# Patient Record
Sex: Female | Born: 1990 | Race: White | Hispanic: No | Marital: Single | Smoking: Never smoker
Health system: Southern US, Community
[De-identification: ages and names within clinical notes are randomized; demographics above are authoritative.]

## PROBLEM LIST (undated history)

## (undated) DIAGNOSIS — J3501 Chronic tonsillitis: Secondary | ICD-10-CM

## (undated) DIAGNOSIS — F32A Depression, unspecified: Secondary | ICD-10-CM

## (undated) DIAGNOSIS — F419 Anxiety disorder, unspecified: Secondary | ICD-10-CM

## (undated) DIAGNOSIS — F329 Major depressive disorder, single episode, unspecified: Secondary | ICD-10-CM

## (undated) DIAGNOSIS — R110 Nausea: Secondary | ICD-10-CM

## (undated) DIAGNOSIS — G43019 Migraine without aura, intractable, without status migrainosus: Principal | ICD-10-CM

## (undated) DIAGNOSIS — F40231 Fear of injections and transfusions: Secondary | ICD-10-CM

## (undated) HISTORY — DX: Nausea: R11.0

## (undated) HISTORY — DX: Migraine without aura, intractable, without status migrainosus: G43.019

## (undated) HISTORY — PX: WISDOM TOOTH EXTRACTION: SHX21

---

## 2012-01-01 ENCOUNTER — Encounter: Payer: Self-pay | Admitting: *Deleted

## 2012-01-01 ENCOUNTER — Emergency Department (HOSPITAL_COMMUNITY)
Admission: EM | Admit: 2012-01-01 | Discharge: 2012-01-01 | Disposition: A | Discharge status: Home / Self Care | Payer: 59 | Source: Home / Self Care | Attending: Emergency Medicine | Admitting: Emergency Medicine

## 2012-01-01 DIAGNOSIS — L920 Granuloma annulare: Secondary | ICD-10-CM

## 2012-01-01 DIAGNOSIS — L538 Other specified erythematous conditions: Secondary | ICD-10-CM

## 2012-01-01 MED ORDER — TRIAMCINOLONE ACETONIDE 0.1 % EX CREA: TOPICAL_CREAM | Freq: Three times a day (TID) | CUTANEOUS | Status: AC

## 2012-01-01 NOTE — ED Notes (Signed)
pT HAS  A  SMALL  RED  LESION  TO  HER   L  UPPER  ARM  WHICH  SHE  SAYS    HAS  BEEN THERE  FOR  ABOUT  1  WEEK       SHE  IS  BEING TX  FOR A  TOENAIL  FUNGUS  AND  IS TAKING  LAMISIL  FOR THE  SYMPTOMS

## 2012-01-01 NOTE — ED Provider Notes (Signed)
History     CSN: [id number]  Arrival date & time 01/01/12  1310   First MD Initiated Contact with Patient 01/01/12 1520      Chief Complaint  Patient presents with  . Rash    (Consider location/radiation/quality/duration/timing/severity/associated sxs/prior treatment) HPI Comments: Ms. Morello has had a one-week history of a mildly pruritic rash on the left upper arm. This has been somewhat scaly and seems to be enlarging in size. Right now she is taking Lamisil for a fungal toenail infection. She denies coming in contact with anyone with a similar rash and denies any other skin lesions.  Patient is a 21 y.o. female presenting with rash.  Rash     History reviewed. No pertinent past medical history.  History reviewed. No pertinent past surgical history.  History reviewed. No pertinent family history.  History  Substance Use Topics  . Smoking status: Not on file  . Smokeless tobacco: Not on file  . Alcohol Use:     OB History    Grav Para Term Preterm Abortions TAB SAB Ect Mult Living                  Review of Systems  Constitutional: Negative for fever and chills.  Skin: Positive for rash. Negative for color change, pallor and wound.    Allergies  Review of patient's allergies indicates no known allergies.  Home Medications   Current Outpatient Rx  Name Route Sig Dispense Refill  . TERBINAFINE HCL 250 MG PO TABS Oral Take 250 mg by mouth daily.      . TRIAMCINOLONE ACETONIDE 0.1 % EX CREA Topical Apply topically 3 (three) times daily. 30 g 0    BP 134/70  Pulse 80  Temp(Src) 99 F (37.2 C) (Oral)  Resp 20  SpO2 98%  LMP 12/24/2011  Physical Exam  Nursing note and vitals reviewed. Constitutional: She appears well-developed and well-nourished. No distress.  Skin: Skin is warm and dry. Rash noted. No abrasion, no bruising, no ecchymosis and no lesion noted. She is not diaphoretic. No erythema. No pallor.       There is a 5 mm, round, scaly patch on her  left upper arm with raised, well-demarcated borders.    ED Course  Procedures (including critical care time)  Labs Reviewed - No data to display No results found.   1. Granuloma annulare       MDM  My first thought upon seeing this was tenia corporis, however she is on Lamisil for toenail fungus so this makes it less likely. The other diagnostic consideration would be granuloma annulare, so we'll go ahead and treat with steroid creams.        Roque Lias, MD 01/01/12 [phone]

## 2015-07-31 DIAGNOSIS — J3501 Chronic tonsillitis: Secondary | ICD-10-CM

## 2015-07-31 HISTORY — DX: Chronic tonsillitis: J35.01

## 2015-08-01 ENCOUNTER — Encounter (HOSPITAL_BASED_OUTPATIENT_CLINIC_OR_DEPARTMENT_OTHER): Payer: Self-pay | Admitting: *Deleted

## 2015-08-04 ENCOUNTER — Ambulatory Visit (HOSPITAL_BASED_OUTPATIENT_CLINIC_OR_DEPARTMENT_OTHER): Payer: 59 | Admitting: Anesthesiology

## 2015-08-04 ENCOUNTER — Ambulatory Visit (HOSPITAL_BASED_OUTPATIENT_CLINIC_OR_DEPARTMENT_OTHER)
Admission: RE | Admit: 2015-08-04 | Discharge: 2015-08-05 | Disposition: A | Discharge status: Home / Self Care | Payer: 59 | Source: Ambulatory Visit | Attending: Otolaryngology | Admitting: Otolaryngology

## 2015-08-04 ENCOUNTER — Encounter (HOSPITAL_BASED_OUTPATIENT_CLINIC_OR_DEPARTMENT_OTHER): Payer: Self-pay | Admitting: *Deleted

## 2015-08-04 ENCOUNTER — Encounter (HOSPITAL_BASED_OUTPATIENT_CLINIC_OR_DEPARTMENT_OTHER)
Admission: RE | Disposition: A | Discharge status: Home / Self Care | Payer: Self-pay | Source: Ambulatory Visit | Attending: Otolaryngology

## 2015-08-04 DIAGNOSIS — J3501 Chronic tonsillitis: Secondary | ICD-10-CM | POA: Diagnosis present

## 2015-08-04 HISTORY — DX: Fear of injections and transfusions: F40.231

## 2015-08-04 HISTORY — PX: TONSILLECTOMY: SHX5217

## 2015-08-04 HISTORY — DX: Anxiety disorder, unspecified: F41.9

## 2015-08-04 HISTORY — DX: Major depressive disorder, single episode, unspecified: F32.9

## 2015-08-04 HISTORY — DX: Chronic tonsillitis: J35.01

## 2015-08-04 HISTORY — DX: Depression, unspecified: F32.A

## 2015-08-04 SURGERY — TONSILLECTOMY
Anesthesia: General | Site: Mouth

## 2015-08-04 MED ORDER — BACITRACIN ZINC 500 UNIT/GM EX OINT: 1.0000 "application " | TOPICAL_OINTMENT | Freq: Three times a day (TID) | CUTANEOUS | Status: DC

## 2015-08-04 MED ORDER — OXYCODONE HCL 5 MG/5ML PO SOLN: 5.0000 mg | ORAL | Status: DC | PRN

## 2015-08-04 MED ORDER — PROMETHAZINE HCL 25 MG/ML IJ SOLN: 6.2500 mg | INTRAMUSCULAR | Status: DC | PRN

## 2015-08-04 MED ORDER — SUCCINYLCHOLINE CHLORIDE 20 MG/ML IJ SOLN
INTRAMUSCULAR | Status: DC | PRN
  Administered 2015-08-04: 100 mg via INTRAVENOUS

## 2015-08-04 MED ORDER — LIDOCAINE 4 % EX CREA
TOPICAL_CREAM | CUTANEOUS | Status: AC
  Filled 2015-08-04: qty 5

## 2015-08-04 MED ORDER — FENTANYL CITRATE (PF) 100 MCG/2ML IJ SOLN
50.0000 ug | INTRAMUSCULAR | Status: DC | PRN
  Administered 2015-08-04 (×2): 50 ug via INTRAVENOUS

## 2015-08-04 MED ORDER — MEPERIDINE HCL 25 MG/ML IJ SOLN: 6.2500 mg | INTRAMUSCULAR | Status: DC | PRN

## 2015-08-04 MED ORDER — OXYCODONE HCL 5 MG PO TABS: 5.0000 mg | ORAL_TABLET | Freq: Once | ORAL | Status: AC | PRN

## 2015-08-04 MED ORDER — MIDAZOLAM HCL 2 MG/2ML IJ SOLN
1.0000 mg | INTRAMUSCULAR | Status: DC | PRN
  Administered 2015-08-04: 1 mg via INTRAVENOUS

## 2015-08-04 MED ORDER — HYDROMORPHONE HCL 1 MG/ML IJ SOLN
INTRAMUSCULAR | Status: AC
  Filled 2015-08-04: qty 1

## 2015-08-04 MED ORDER — MIDAZOLAM HCL 2 MG/2ML IJ SOLN
INTRAMUSCULAR | Status: AC
  Filled 2015-08-04: qty 2

## 2015-08-04 MED ORDER — FENTANYL CITRATE (PF) 100 MCG/2ML IJ SOLN
INTRAMUSCULAR | Status: AC
  Filled 2015-08-04: qty 4

## 2015-08-04 MED ORDER — PROPOFOL 10 MG/ML IV BOLUS
INTRAVENOUS | Status: DC | PRN
  Administered 2015-08-04: 100 mg via INTRAVENOUS

## 2015-08-04 MED ORDER — PROPOFOL 10 MG/ML IV BOLUS
INTRAVENOUS | Status: AC
  Filled 2015-08-04: qty 20

## 2015-08-04 MED ORDER — ONDANSETRON HCL 4 MG/2ML IJ SOLN
4.0000 mg | INTRAMUSCULAR | Status: DC | PRN
Start: 2015-08-04 — End: 2015-08-05

## 2015-08-04 MED ORDER — DEXAMETHASONE SODIUM PHOSPHATE 4 MG/ML IJ SOLN
INTRAMUSCULAR | Status: DC | PRN
  Administered 2015-08-04: 10 mg via INTRAVENOUS

## 2015-08-04 MED ORDER — OXYCODONE HCL 5 MG/5ML PO SOLN
5.0000 mg | ORAL | Status: DC | PRN
  Administered 2015-08-04 – 2015-08-05 (×3): 5 mg via ORAL
  Filled 2015-08-04 (×2): qty 5

## 2015-08-04 MED ORDER — ONDANSETRON HCL 4 MG PO TABS: 4.0000 mg | ORAL_TABLET | ORAL | Status: DC | PRN

## 2015-08-04 MED ORDER — HYDROMORPHONE HCL 1 MG/ML IJ SOLN
0.2500 mg | INTRAMUSCULAR | Status: DC | PRN
  Administered 2015-08-04: 0.5 mg via INTRAVENOUS
  Administered 2015-08-04: 0.25 mg via INTRAVENOUS

## 2015-08-04 MED ORDER — GLYCOPYRROLATE 0.2 MG/ML IJ SOLN: 0.2000 mg | Freq: Once | INTRAMUSCULAR | Status: DC | PRN

## 2015-08-04 MED ORDER — OXYCODONE HCL 5 MG/5ML PO SOLN
5.0000 mg | Freq: Once | ORAL | Status: AC | PRN
  Filled 2015-08-04: qty 5

## 2015-08-04 MED ORDER — LACTATED RINGERS IV SOLN
INTRAVENOUS | Status: DC
  Administered 2015-08-04: 11:00:00 via INTRAVENOUS

## 2015-08-04 MED ORDER — SCOPOLAMINE 1 MG/3DAYS TD PT72
1.0000 | MEDICATED_PATCH | Freq: Once | TRANSDERMAL | Status: DC | PRN
  Administered 2015-08-04: 1.5 mg via TRANSDERMAL

## 2015-08-04 MED ORDER — ONDANSETRON HCL 4 MG/2ML IJ SOLN
INTRAMUSCULAR | Status: DC | PRN
  Administered 2015-08-04: 4 mg via INTRAVENOUS

## 2015-08-04 MED ORDER — SCOPOLAMINE 1 MG/3DAYS TD PT72
MEDICATED_PATCH | TRANSDERMAL | Status: AC
Start: 2015-08-04 — End: 2015-08-04
  Filled 2015-08-04: qty 1

## 2015-08-04 MED ORDER — DEXTROSE-NACL 5-0.45 % IV SOLN
INTRAVENOUS | Status: DC
  Administered 2015-08-04: 12:00:00 via INTRAVENOUS

## 2015-08-04 SURGICAL SUPPLY — 32 items
CANISTER SUCT 1200ML W/VALVE (MISCELLANEOUS) ×3 IMPLANT
CATH ROBINSON RED A/P 12FR (CATHETERS) IMPLANT
CLEANER CAUTERY TIP 5X5 PAD (MISCELLANEOUS) ×1 IMPLANT
COAGULATOR SUCT 6 FR SWTCH (ELECTROSURGICAL) ×1
COAGULATOR SUCT SWTCH 10FR 6 (ELECTROSURGICAL) ×2 IMPLANT
COVER MAYO STAND STRL (DRAPES) ×3 IMPLANT
ELECT COATED BLADE 2.86 ST (ELECTRODE) ×3 IMPLANT
ELECT REM PT RETURN 9FT ADLT (ELECTROSURGICAL)
ELECT REM PT RETURN 9FT PED (ELECTROSURGICAL)
ELECTRODE REM PT RETRN 9FT PED (ELECTROSURGICAL) IMPLANT
ELECTRODE REM PT RTRN 9FT ADLT (ELECTROSURGICAL) IMPLANT
GLOVE BIOGEL PI IND STRL 7.0 (GLOVE) ×2 IMPLANT
GLOVE BIOGEL PI INDICATOR 7.0 (GLOVE) ×4
GLOVE ECLIPSE 6.5 STRL STRAW (GLOVE) ×3 IMPLANT
GLOVE SS BIOGEL STRL SZ 7.5 (GLOVE) ×1 IMPLANT
GLOVE SUPERSENSE BIOGEL SZ 7.5 (GLOVE) ×2
GOWN STRL REUS W/ TWL LRG LVL3 (GOWN DISPOSABLE) ×2 IMPLANT
GOWN STRL REUS W/TWL LRG LVL3 (GOWN DISPOSABLE) ×4
MARKER SKIN DUAL TIP RULER LAB (MISCELLANEOUS) IMPLANT
NS IRRIG 1000ML POUR BTL (IV SOLUTION) ×3 IMPLANT
PAD CLEANER CAUTERY TIP 5X5 (MISCELLANEOUS) ×2
PENCIL FOOT CONTROL (ELECTRODE) ×3 IMPLANT
SHEET MEDIUM DRAPE 40X70 STRL (DRAPES) ×3 IMPLANT
SPONGE GAUZE 4X4 12PLY STER LF (GAUZE/BANDAGES/DRESSINGS) ×3 IMPLANT
SPONGE TONSIL 1 RF SGL (DISPOSABLE) IMPLANT
SPONGE TONSIL 1.25 RF SGL STRG (GAUZE/BANDAGES/DRESSINGS) IMPLANT
SYR BULB 3OZ (MISCELLANEOUS) ×3 IMPLANT
TOWEL OR 17X24 6PK STRL BLUE (TOWEL DISPOSABLE) ×3 IMPLANT
TUBE CONNECTING 20'X1/4 (TUBING) ×1
TUBE CONNECTING 20X1/4 (TUBING) ×2 IMPLANT
TUBE SALEM SUMP 12R W/ARV (TUBING) IMPLANT
TUBE SALEM SUMP 16 FR W/ARV (TUBING) IMPLANT

## 2015-08-04 NOTE — Op Note (Signed)
Preop/postop diagnosis: Chronic tonsillitis Procedure: Tonsillectomy Anesthesia: Gen. Estimated blood loss: Less than 5 mL Indications: 24 year old who's had chronic reproductive tonsil issues with cryptic debris. She was informed risks and benefits of the procedure and options were discussed all questions are answered and consent was obtained. Procedure: Patient was taken to the operating room placed in the supine position after general endotracheal tube anesthesia was placed in the rose position. She was draped in the usual sterile manner. The Crowe Davis mouth gag was inserted retracted and suspended from the Mayo stand. The left tonsil begun making a left answer tonsillar pillar incision identifying the capsule tonsil and removing it with electrocautery dissection right tonsil removed in the same fashion. Both tonsils had cryptic debris throughout. Hemostasis achieved with suction cautery. The hypopharynx esophagus stomach were suctioned with the NG tube. The Crowe-Davis was released and resuspended and is hemostasis present in all locations. The patient was then awakened brought to recovery room in stable condition counts correct

## 2015-08-04 NOTE — Anesthesia Procedure Notes (Signed)
Procedure Name: Intubation Date/Time: 08/04/2015 10:36 AM Performed by: Gar Gibbon Pre-anesthesia Checklist: Patient identified, Emergency Drugs available, Suction available and Patient being monitored Patient Re-evaluated:Patient Re-evaluated prior to inductionOxygen Delivery Method: Circle System Utilized Intubation Type: Inhalational induction Ventilation: Mask ventilation without difficulty and Oral airway inserted - appropriate to patient size Laryngoscope Size: Miller and 2 Tube type: Oral Tube size: 7.0 mm Number of attempts: 1 Airway Equipment and Method: Stylet Placement Confirmation: ETT inserted through vocal cords under direct vision,  positive ETCO2 and breath sounds checked- equal and bilateral Secured at: 22 cm Tube secured with: Tape Dental Injury: Teeth and Oropharynx as per pre-operative assessment

## 2015-08-04 NOTE — Transfer of Care (Signed)
Immediate Anesthesia Transfer of Care Note  Patient: Valerie Sherman  Procedure(s) Performed: Procedure(s): TONSILLECTOMY (N/A)  Patient Location: PACU  Anesthesia Type:General  Level of Consciousness: alert , sedated and patient cooperative  Airway & Oxygen Therapy: Patient Spontanous Breathing and aerosol face mask  Post-op Assessment: Report given to RN and Post -op Vital signs reviewed and stable  Post vital signs: Reviewed and stable  Last Vitals:  Filed Vitals:   08/04/15 0919  BP: 128/71  Pulse: 90  Temp: 36.9 C  Resp: 18    Complications: No apparent anesthesia complications

## 2015-08-04 NOTE — H&P (Signed)
Valerie Sherman is an 24 y.o. female.   Chief Complaint: tonsillits HPI: hx of tonsillitis and now ready to have removed  Past Medical History  Diagnosis Date  . Chronic tonsillitis 07/2015  . Anxiety   . Depression   . Severe needle phobia     Past Surgical History  Procedure Laterality Date  . Wisdom tooth extraction      History reviewed. No pertinent family history. Social History:  reports that she has never smoked. She has never used smokeless tobacco. She reports that she drinks alcohol. She reports that she does not use illicit drugs.  Allergies:  Allergies  Allergen Reactions  . Augmentin [Amoxicillin-Pot Clavulanate] Nausea And Vomiting    Medications Prior to Admission  Medication Sig Dispense Refill  . cholecalciferol (VITAMIN D) 1000 UNITS tablet Take 4,000 Units by mouth daily.    Marland Kitchen escitalopram (LEXAPRO) 20 MG tablet Take 20 mg by mouth daily.    Marland Kitchen etonogestrel-ethinyl estradiol (NUVARING) 0.12-0.015 MG/24HR vaginal ring Place 1 each vaginally every 28 (twenty-eight) days. Insert vaginally and leave in place for 3 consecutive weeks, then remove for 1 week.    . hydrOXYzine (ATARAX/VISTARIL) 25 MG tablet Take 25 mg by mouth 3 (three) times daily as needed.    Marland Kitchen LYSINE PO Take 1 g by mouth daily.    . Multiple Vitamin (MULTIVITAMIN) tablet Take 1 tablet by mouth daily.      No results found for this or any previous visit (from the past 48 hour(s)). No results found.  Review of Systems  Constitutional: Negative.   HENT: Negative.   Eyes: Negative.   Respiratory: Negative.   Cardiovascular: Negative.   Gastrointestinal: Negative.   Skin: Negative.     Blood pressure 128/71, pulse 90, temperature 98.4 F (36.9 C), temperature source Oral, resp. rate 18, height  (1.702 m), weight 65.318 kg (144 lb), last menstrual period 06/21/2015, SpO2 100 %. Physical Exam  Constitutional: She appears well-developed and well-nourished.  HENT:  Head: Normocephalic  and atraumatic.  Nose: Nose normal.  Eyes: Conjunctivae are normal. Pupils are equal, round, and reactive to light.  Neck: Normal range of motion. Neck supple.  Cardiovascular: Normal rate.   Respiratory: Effort normal.  GI: Soft.  Musculoskeletal: Normal range of motion.     Assessment/Plan Chronic tonsillitis- ready to proceed with surgery and procedure discussed  Suzanna Obey 08/04/2015, 10:15 AM

## 2015-08-04 NOTE — Anesthesia Preprocedure Evaluation (Signed)
Anesthesia Evaluation  Patient identified by MRN, date of birth, ID band Patient awake    Reviewed: Allergy & Precautions, NPO status , Patient's Chart, lab work & pertinent test results  Airway Mallampati: II  TM Distance: >3 FB Neck ROM: Full    Dental no notable dental hx.    Pulmonary neg pulmonary ROS,  breath sounds clear to auscultation  Pulmonary exam normal       Cardiovascular negative cardio ROS Normal cardiovascular examRhythm:Regular Rate:Normal     Neuro/Psych PSYCHIATRIC DISORDERS negative neurological ROS     GI/Hepatic negative GI ROS, Neg liver ROS,   Endo/Other  negative endocrine ROS  Renal/GU negative Renal ROS     Musculoskeletal negative musculoskeletal ROS (+)   Abdominal   Peds  Hematology negative hematology ROS (+)   Anesthesia Other Findings   Reproductive/Obstetrics negative OB ROS                             Anesthesia Physical Anesthesia Plan  ASA: I  Anesthesia Plan: General   Post-op Pain Management:    Induction: Intravenous  Airway Management Planned: Oral ETT  Additional Equipment:   Intra-op Plan:   Post-operative Plan: Extubation in OR  Informed Consent: I have reviewed the patients History and Physical, chart, labs and discussed the procedure including the risks, benefits and alternatives for the proposed anesthesia with the patient or authorized representative who has indicated his/her understanding and acceptance.   Dental advisory given  Plan Discussed with: CRNA  Anesthesia Plan Comments:         Anesthesia Quick Evaluation

## 2015-08-04 NOTE — Anesthesia Postprocedure Evaluation (Signed)
  Anesthesia Post-op Note  Patient: Valerie Sherman  Procedure(s) Performed: Procedure(s): TONSILLECTOMY (N/A)  Patient Location: PACU  Anesthesia Type: General   Level of Consciousness: awake, alert  and oriented  Airway and Oxygen Therapy: Patient Spontanous Breathing  Post-op Pain: mild  Post-op Assessment: Post-op Vital signs reviewed  Post-op Vital Signs: Reviewed  Last Vitals:  Filed Vitals:   08/04/15 1142  BP: 138/80  Pulse: 85  Temp: 36.1 C  Resp: 18    Complications: No apparent anesthesia complications

## 2015-08-04 NOTE — Discharge Instructions (Addendum)
Standard tonsillectomy instructions given to you from [facility] outpatient should be followed. Call if there's any bleeding or any questions whatsoever.Tonsillectomy A tonsillectomy is a surgery to remove your tonsils. Tonsils are lymph tissues at the back and upper part of your throat. Because tonsils can collect debris, they can become infected. Tonsillectomy often is done when nonsurgical treatments have been unsuccessful in resolving problems with tonsils.  LET [facility] CARE PROVIDER KNOW ABOUT:  Any allergies you have.  All medicines you are taking, including vitamins, herbs, eye drops, creams, and over-the-counter medicines, especially those containing aspirin or ibuprofen.  Previous problems you or members of your family have had with the use of anesthetics.  Any blood disorders you have.  Previous surgeries you have had.  Medical conditions you have.  Recent cough or fever you have had. RISKS AND COMPLICATIONS Generally, tonsillectomy is a safe procedure. However, as with any procedure, problems can occur. Possible problems include:  Bleeding.  Infection.  Scarring.  Changes in your sense of taste.  Changes in your voice.  Changes in swallowing. PROCEDURE   You will be given a medicine that makes you go to sleep (general anesthetic).  A device will be placed inside of your mouth that presses your tongue down so that the tonsils at the back of your throat can be removed without cuts on the outside of your neck or throat.  Your tonsils will typically be removed with a device called an electrocautery, which will cut your tonsils out and then shrink the surrounding blood vessels at the same time so that you will not bleed after the procedure.  Usually, stitches will not be used to close the cut. A white scab (eschar) will form in the area where your tonsils used to be. AFTER THE PROCEDURE After surgery, you will be taken to a recovery area for close monitoring. Once  you are awake, stable, and taking fluids well, you will be allowed to go home.

## 2015-08-05 DIAGNOSIS — J3501 Chronic tonsillitis: Secondary | ICD-10-CM | POA: Diagnosis not present

## 2015-08-07 ENCOUNTER — Encounter (HOSPITAL_BASED_OUTPATIENT_CLINIC_OR_DEPARTMENT_OTHER): Payer: Self-pay | Admitting: Otolaryngology

## 2015-08-08 ENCOUNTER — Other Ambulatory Visit (HOSPITAL_COMMUNITY)
Admission: RE | Admit: 2015-08-08 | Discharge: 2015-08-08 | Disposition: A | Discharge status: Home / Self Care | Payer: 59 | Source: Ambulatory Visit | Attending: Obstetrics and Gynecology | Admitting: Obstetrics and Gynecology

## 2015-08-08 ENCOUNTER — Other Ambulatory Visit: Payer: Self-pay | Admitting: Obstetrics and Gynecology

## 2015-08-08 DIAGNOSIS — Z01419 Encounter for gynecological examination (general) (routine) without abnormal findings: Secondary | ICD-10-CM | POA: Insufficient documentation

## 2015-08-09 LAB — CYTOLOGY - PAP

## 2015-11-15 ENCOUNTER — Ambulatory Visit (INDEPENDENT_AMBULATORY_CARE_PROVIDER_SITE_OTHER): Payer: 59 | Admitting: Family Medicine

## 2015-11-15 VITALS — BP 118/70 | HR 91 | Temp 98.4°F | Resp 18 | Ht 67.0 in | Wt 138.0 lb

## 2015-11-15 DIAGNOSIS — Z23 Encounter for immunization: Secondary | ICD-10-CM

## 2015-11-15 DIAGNOSIS — G479 Sleep disorder, unspecified: Secondary | ICD-10-CM | POA: Diagnosis not present

## 2015-11-15 DIAGNOSIS — F32A Depression, unspecified: Secondary | ICD-10-CM

## 2015-11-15 DIAGNOSIS — F411 Generalized anxiety disorder: Secondary | ICD-10-CM

## 2015-11-15 DIAGNOSIS — Z0000 Encounter for general adult medical examination without abnormal findings: Secondary | ICD-10-CM

## 2015-11-15 DIAGNOSIS — F329 Major depressive disorder, single episode, unspecified: Secondary | ICD-10-CM | POA: Diagnosis not present

## 2015-11-15 MED ORDER — HYDROXYZINE HCL 25 MG PO TABS: 25.0000 mg | ORAL_TABLET | Freq: Three times a day (TID) | ORAL | Status: DC | PRN

## 2015-11-15 MED ORDER — ESCITALOPRAM OXALATE 20 MG PO TABS: 20.0000 mg | ORAL_TABLET | Freq: Every day | ORAL | Status: DC

## 2015-11-15 NOTE — Progress Notes (Signed)
Patient ID: Valerie Sherman, female    DOB: 03-10-1991  Age: 24 y.o. MRN: [id number]  Chief Complaint  Patient presents with  . Annual Exam    has form   . Medication Refill    lexapro   . Depression    see screen    Subjective:   24 year old female who is here for an annual physical examination. She needs a refill of her Lexapro also. She has been in school, finished up with a final internship, to graduate and get out in the job work soon. She has a form for the school system.  Past medical history: Medications: Lexapro, NuvaRing, hydroxyzine Allergies: Augmentin Medical history: Anxiety and depression Surgeries: Tonsillectomy  Family history: Parents are living. Father is had Hodgkin's disease and high cholesterol. Mother is healthy. Brother has been healthy also.  Social history: Does not smoke or use drugs. She does drink couple of drinks a week. She is single has had female sexual partners. She is becoming a Runner, broadcasting/film/videoteacher. She has college education from [employer]. She does get exercise.  Review of systems: Constitutional: She has had decreased exercise and appetite has not been good. She feels like sleeping all the time. She is not lost weight however. HEENT: Has had a sore throat from what she thinks is postnasal drainage. Eyes: Wears contacts Respiratory: Has some cough Cardiovascular: Unremarkable Gastrointestinal: Unremarkable Genitourinary: Unremarkable Musculoskeletal: Unremarkable Dermatologic: Unremarkable Endocrine: Allergy/immunology: Seasonal allergies Neurologic: Unremarkable Hematologic: Unremarkable Psychiatric: Has a sad mood and is nervous and anxious often. She does have some sleep disturbance for which she uses hydroxyzine. She denies suicidal ideation.  Physical exam: Healthy-appearing young lady, alert and oriented. TMs are normal. Eyes PERRLA. Throat clear except for a little erythema down the right side. Without nodes or thyromegaly.  Chest is clear to auscultation. Heart regular without murmurs gallops or arrhythmias. Abdomen is soft without masses or tenderness. Breast and pelvic exam not done. Extremities unremarkable. Skin unremarkable.  Current allergies, medications, problem list, past/family and social histories reviewed.  Objective:  BP 118/70 mmHg  Pulse 91  Temp(Src) 98.4 F (36.9 C) (Oral)  Resp 18  Ht 5\' 7"  (1.702 m)  Wt 138 lb (62.596 kg)  BMI 21.61 kg/m2  SpO2 98%  LMP 09/14/2015  See above  Assessment & Plan:   Assessment: No diagnosis found.    Plan: Complete form for school job application. She had a PPD test earlier this year and I do not believe she needs one as she has no known exposures.        There are no Patient Instructions on file for this visit.   No Follow-up on file.   Valerie Gladwin, MD 11/15/2015

## 2015-11-15 NOTE — Progress Notes (Signed)

## 2015-11-15 NOTE — Patient Instructions (Signed)
Continue current meds  Flu shot  Consider getting a hepatitis A vaccine at some point  Try to discuss with him other whether you do need any more Gardasil vaccine or not.  Return as needed  When you get settled and a job I recommend you start seeing a counselor or psychologist for your ongoing anxiety and depression needs

## 2015-11-15 NOTE — Progress Notes (Signed)
Patient ID: Domingo Mendlanah J Gravelle, female    DOB: 27-Jul-1991  Age: 24 y.o. MRN: [id number]  Anxiety and depression Subjective:   Patient needs a refill on her medications for anxiety and depression. She continues to struggle with this. She has not seen a counselor or psychologist for a long time. She [facility] her [facility] [employer] [facility]. 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 She is ready to leave her parents home and get her own place and get some income in life. She thinks she will be happier then.  Current allergies, medications, problem list, past/family and social histories reviewed.  Objective:  BP 118/70 mmHg  Pulse 91  Temp(Src) 98.4 F (36.9 C) (Oral)  Resp 18  Ht 5\' 7"  (1.702 m)  Wt 138 lb (62.596 kg)  BMI 21.61 kg/m2  SpO2 98%  LMP 09/14/2015  No suicidal ideation. No major complaints. Discussed her anxiety and depression.  Assessment & Plan:   Assessment: Anxiety and depression   Plan: Continue current medications Recommended getting a counselor or psychologist when she gets settled a little bit more.  Orders Placed This Encounter  Procedures  . Flu Vaccine QUAD 36+ mos IM    Meds ordered this encounter  Medications  . escitalopram (LEXAPRO) 20 MG tablet    Sig: Take 1 tablet (20 mg total) by mouth daily.    Dispense:  30 tablet    Refill:  5  . hydrOXYzine (ATARAX/VISTARIL) 25 MG tablet    Sig: Take 1 tablet (25 mg total) by mouth 3 (three) times daily as needed.    Dispense:  30 tablet    Refill:  5         Patient Instructions  Continue current meds  Flu shot  Consider getting a hepatitis A vaccine at some point  Try to discuss with him other whether you do need any more Gardasil vaccine or not.  Return as needed  When you get settled and a job I recommend you start seeing a counselor or psychologist for your ongoing anxiety and depression needs      No Follow-up on  file.   HOPPER,DAVID, MD 11/15/2015

## 2016-01-26 MED FILL — NUVARING VAGINAL RING: 0.12-0.015 | 84 days supply | Qty: 3 | Fill #2

## 2016-01-26 MED FILL — ESCITALOPRAM 20 MG TABLET: 20 | 30 days supply | Qty: 30 | Fill #1

## 2016-01-26 MED FILL — hydrOXYzine HCL 25 MG TABS: 25 | 10 days supply | Qty: 30 | Fill #1

## 2016-03-06 ENCOUNTER — Emergency Department (HOSPITAL_COMMUNITY)
Admission: EM | Admit: 2016-03-06 | Discharge: 2016-03-07 | Disposition: A | Discharge status: Home / Self Care | Payer: 59 | Attending: Emergency Medicine | Admitting: Emergency Medicine

## 2016-03-06 ENCOUNTER — Encounter (HOSPITAL_COMMUNITY): Payer: Self-pay

## 2016-03-06 DIAGNOSIS — R079 Chest pain, unspecified: Secondary | ICD-10-CM | POA: Insufficient documentation

## 2016-03-06 DIAGNOSIS — Z79899 Other long term (current) drug therapy: Secondary | ICD-10-CM | POA: Diagnosis not present

## 2016-03-06 DIAGNOSIS — Z3202 Encounter for pregnancy test, result negative: Secondary | ICD-10-CM | POA: Diagnosis not present

## 2016-03-06 DIAGNOSIS — Z7982 Long term (current) use of aspirin: Secondary | ICD-10-CM | POA: Insufficient documentation

## 2016-03-06 DIAGNOSIS — Z8709 Personal history of other diseases of the respiratory system: Secondary | ICD-10-CM | POA: Diagnosis not present

## 2016-03-06 DIAGNOSIS — F329 Major depressive disorder, single episode, unspecified: Secondary | ICD-10-CM | POA: Diagnosis not present

## 2016-03-06 DIAGNOSIS — F419 Anxiety disorder, unspecified: Secondary | ICD-10-CM | POA: Insufficient documentation

## 2016-03-06 DIAGNOSIS — R1012 Left upper quadrant pain: Secondary | ICD-10-CM | POA: Diagnosis not present

## 2016-03-06 LAB — CBC
HEMATOCRIT: 34.2 % — AB (ref 36.0–46.0)
Hemoglobin: 11.4 g/dL — ABNORMAL LOW (ref 12.0–15.0)
MCH: 28 pg (ref 26.0–34.0)
MCHC: 33.3 g/dL (ref 30.0–36.0)
MCV: 84 fL (ref 78.0–100.0)
Platelets: 222 10*3/uL (ref 150–400)
RBC: 4.07 MIL/uL (ref 3.87–5.11)
RDW: 12.7 % (ref 11.5–15.5)
WBC: 8.8 10*3/uL (ref 4.0–10.5)

## 2016-03-06 LAB — COMPREHENSIVE METABOLIC PANEL
ALT: 10 U/L — ABNORMAL LOW (ref 14–54)
AST: 17 U/L (ref 15–41)
Albumin: 4 g/dL (ref 3.5–5.0)
Alkaline Phosphatase: 48 U/L (ref 38–126)
Anion gap: 10 (ref 5–15)
BUN: 10 mg/dL (ref 6–20)
CHLORIDE: 107 mmol/L (ref 101–111)
CO2: 22 mmol/L (ref 22–32)
Calcium: 8.9 mg/dL (ref 8.9–10.3)
Creatinine, Ser: 0.58 mg/dL (ref 0.44–1.00)
GFR calc Af Amer: >60 mL/min (ref >60)
Glucose, Bld: 94 mg/dL (ref 65–99)
POTASSIUM: 3.7 mmol/L (ref 3.5–5.1)
Sodium: 139 mmol/L (ref 135–145)
Total Bilirubin: 0.5 mg/dL (ref 0.3–1.2)
Total Protein: 6.9 g/dL (ref 6.5–8.1)

## 2016-03-06 LAB — D-DIMER, QUANTITATIVE (NOT AT ARMC): D DIMER QUANT: 0.62 ug{FEU}/mL — AB (ref 0.00–0.50)

## 2016-03-06 LAB — URINALYSIS, ROUTINE W REFLEX MICROSCOPIC
Bilirubin Urine: NEGATIVE
GLUCOSE, UA: NEGATIVE mg/dL
Hgb urine dipstick: NEGATIVE
Ketones, ur: NEGATIVE mg/dL
Leukocytes, UA: NEGATIVE
Nitrite: NEGATIVE
PROTEIN: NEGATIVE mg/dL
Specific Gravity, Urine: 1.02 (ref 1.005–1.030)
pH: 6 (ref 5.0–8.0)

## 2016-03-06 LAB — I-STAT BETA HCG BLOOD, ED (MC, WL, AP ONLY): I-stat hCG, quantitative: <5 m[IU]/mL (ref <5)

## 2016-03-06 LAB — LIPASE, BLOOD: Lipase: 34 U/L (ref 11–51)

## 2016-03-06 MED ORDER — GI COCKTAIL ~~LOC~~
30.0000 mL | Freq: Once | ORAL | Status: AC
  Administered 2016-03-06: 30 mL via ORAL
  Filled 2016-03-06: qty 30

## 2016-03-06 NOTE — ED Notes (Signed)
Pt here with luq abdominal pain starting today.  No fever, n/v/d.  No constipation.  No vaginal bleeding/discharge.  No difficulty urinating.

## 2016-03-07 ENCOUNTER — Emergency Department (HOSPITAL_COMMUNITY): Payer: 59

## 2016-03-07 ENCOUNTER — Encounter (HOSPITAL_COMMUNITY): Payer: Self-pay

## 2016-03-07 DIAGNOSIS — F419 Anxiety disorder, unspecified: Secondary | ICD-10-CM | POA: Diagnosis not present

## 2016-03-07 DIAGNOSIS — R1012 Left upper quadrant pain: Secondary | ICD-10-CM | POA: Diagnosis not present

## 2016-03-07 DIAGNOSIS — Z8709 Personal history of other diseases of the respiratory system: Secondary | ICD-10-CM | POA: Diagnosis not present

## 2016-03-07 DIAGNOSIS — Z79899 Other long term (current) drug therapy: Secondary | ICD-10-CM | POA: Diagnosis not present

## 2016-03-07 DIAGNOSIS — F329 Major depressive disorder, single episode, unspecified: Secondary | ICD-10-CM | POA: Diagnosis not present

## 2016-03-07 DIAGNOSIS — Z3202 Encounter for pregnancy test, result negative: Secondary | ICD-10-CM | POA: Diagnosis not present

## 2016-03-07 DIAGNOSIS — R079 Chest pain, unspecified: Secondary | ICD-10-CM | POA: Diagnosis not present

## 2016-03-07 DIAGNOSIS — Z7982 Long term (current) use of aspirin: Secondary | ICD-10-CM | POA: Diagnosis not present

## 2016-03-07 MED ORDER — IOHEXOL 350 MG/ML SOLN
100.0000 mL | Freq: Once | INTRAVENOUS | Status: AC | PRN
  Administered 2016-03-07: 100 mL via INTRAVENOUS

## 2016-03-07 MED ORDER — MORPHINE SULFATE (PF) 4 MG/ML IV SOLN
2.0000 mg | Freq: Once | INTRAVENOUS | Status: AC
  Administered 2016-03-07: 2 mg via INTRAVENOUS
  Filled 2016-03-07: qty 1

## 2016-03-07 MED ORDER — ONDANSETRON HCL 4 MG/2ML IJ SOLN
4.0000 mg | Freq: Once | INTRAMUSCULAR | Status: AC
  Administered 2016-03-07: 4 mg via INTRAVENOUS
  Filled 2016-03-07: qty 2

## 2016-03-07 MED ORDER — OXYCODONE-ACETAMINOPHEN 5-325 MG PO TABS: 1.0000 | ORAL_TABLET | ORAL | Status: DC | PRN

## 2016-03-07 MED ORDER — ONDANSETRON 4 MG PO TBDP: 4.0000 mg | ORAL_TABLET | Freq: Three times a day (TID) | ORAL | Status: DC | PRN

## 2016-03-07 MED ORDER — HYDROCODONE-ACETAMINOPHEN 5-325 MG PO TABS
1.0000 | ORAL_TABLET | ORAL | Status: DC | PRN
Start: 2016-03-07 — End: 2016-04-25

## 2016-03-07 NOTE — ED Notes (Signed)
Pt transported to CT ?

## 2016-03-07 NOTE — Discharge Instructions (Signed)

## 2016-03-07 NOTE — ED Provider Notes (Signed)
Presents with LUQ, left lower chest pain - sharp, pleuritic. No fever. REcent 3 hours car travel, on BCP, slightly elevated d-dimer. Pending CTA r/o PE.  CTA negative for significant clot. There are nonspecific findings of "slight infiltration in the abdominal fatty inferior to spleen" of indeterminate etiology. The patient is re-examined and appears comfortable, is in NAD, normal VS. She is discharged home per plan of previous care team.   Elpidio AnisShari Dashanna Kinnamon, PA-C 03/07/16 2310  Tomasita CrumbleAdeleke Oni, MD 03/08/16 [id number]

## 2016-03-07 NOTE — ED Provider Notes (Signed)
CSN: [id number]     Arrival date & time 03/06/16  1719 History   First MD Initiated Contact with Patient 03/06/16 2247     Chief Complaint  Patient presents with  . Abdominal Pain     (Consider location/radiation/quality/duration/timing/severity/associated sxs/prior Treatment) HPI Valerie Sherman is a 25 y.o. femalepresents to emergency department complaining of abdominal pain. Patient states that she has had mild abdominal pain in the left abdomen today. She reports that gradually throughout the day the pain worsened and localized to the left upper quadrant underneath the rib cage. Patient states the pain is worse with deep breathing. She denies pain worsening with palpation or movement. She denies any nausea or vomiting. No diarrhea. Last normal bowel movement was today. She denies any fever. No cough or congestion. She denies any swelling in her extremities. She denies any other chest pain or back pain. She denies any history of blood clots. She does report recent travel in a car but only for 3 hours. She is on NuvaRing for birth control. She states that she took an oxycodone for her pain earlier today that she had left over from her tonsil surgery but it did not help.no history of prior abdominal surgeries or similar pain.  Past Medical History  Diagnosis Date  . Chronic tonsillitis 07/2015  . Anxiety   . Depression   . Severe needle phobia    Past Surgical History  Procedure Laterality Date  . Wisdom tooth extraction    . Tonsillectomy N/A 08/04/2015    Procedure: TONSILLECTOMY;  Surgeon: Suzanna Obey, MD;  Location: [facility];  Service: ENT;  Laterality: N/A;   Family History  Problem Relation Age of Onset  . Cancer Father   . Cancer Maternal Grandfather   . Heart disease Paternal Grandfather    Social History  Substance Use Topics  . Smoking status: Never Smoker   . Smokeless tobacco: Never Used  . Alcohol Use: 1.2 oz/week    2 Standard drinks or equivalent per  week     Comment: socially   OB History    No data available     Review of Systems  Constitutional: Negative for fever and chills.  Respiratory: Negative for cough, chest tightness and shortness of breath.   Cardiovascular: Positive for chest pain. Negative for palpitations and leg swelling.  Gastrointestinal: Positive for abdominal pain. Negative for nausea, vomiting and diarrhea.  Genitourinary: Negative for dysuria, frequency and flank pain.  Musculoskeletal: Negative for myalgias, arthralgias, neck pain and neck stiffness.  Skin: Negative for rash.  Neurological: Negative for dizziness, weakness and headaches.  All other systems reviewed and are negative.     Allergies  Augmentin  Home Medications   Prior to Admission medications   Medication Sig Start Date End Date Taking? Authorizing Provider  aspirin 325 MG tablet Take 325 mg by mouth once.   Yes Historical Provider, MD  escitalopram (LEXAPRO) 20 MG tablet Take 1 tablet (20 mg total) by mouth daily. 11/15/15  Yes Peyton Najjar, MD  etonogestrel-ethinyl estradiol (NUVARING) 0.12-0.015 MG/24HR vaginal ring Place 1 each vaginally every 28 (twenty-eight) days. Insert vaginally and leave in place for 3 consecutive weeks, then remove for 1 week.   Yes Historical Provider, MD  hydrOXYzine (ATARAX/VISTARIL) 25 MG tablet Take 1 tablet (25 mg total) by mouth 3 (three) times daily as needed. Patient taking differently: Take 25 mg by mouth 3 (three) times daily as needed (sleep).  11/15/15  Yes Peyton Najjar,  MD  ibuprofen (ADVIL,MOTRIN) 200 MG tablet Take 200 mg by mouth every 6 (six) hours as needed for moderate pain.   Yes Historical Provider, MD  oxyCODONE (ROXICODONE) 5 MG/5ML solution Take 5 mLs (5 mg total) by mouth every 4 (four) hours as needed for severe pain. Patient not taking: Reported on 11/15/2015 08/04/15   Suzanna ObeyJohn Byers, MD   BP 142/90 mmHg  Pulse 78  Temp(Src) 98.4 F (36.9 C) (Oral)  Resp 16  SpO2 100%  LMP  02/07/2016 Physical Exam  Constitutional: She is oriented to person, place, and time. She appears well-developed and well-nourished. No distress.  HENT:  Head: Normocephalic.  Eyes: Conjunctivae are normal.  Neck: Neck supple.  Cardiovascular: Normal rate, regular rhythm and normal heart sounds.   Pulmonary/Chest: Effort normal and breath sounds normal. No respiratory distress. She has no wheezes. She has no rales.  Abdominal: Soft. Bowel sounds are normal. She exhibits no distension. There is tenderness. There is no rebound.  Mild left upper quadrant tenderness  Musculoskeletal: She exhibits no edema.  Neurological: She is alert and oriented to person, place, and time.  Skin: Skin is warm and dry.  Psychiatric: She has a normal mood and affect. Her behavior is normal.  Nursing note and vitals reviewed.   ED Course  Procedures (including critical care time) Labs Review Labs Reviewed  COMPREHENSIVE METABOLIC PANEL - Abnormal; Notable for the following:    ALT 10 (*)    All other components within normal limits  CBC - Abnormal; Notable for the following:    Hemoglobin 11.4 (*)    HCT 34.2 (*)    All other components within normal limits  D-DIMER, QUANTITATIVE (NOT AT [facility]) - Abnormal; Notable for the following:    D-Dimer, Quant 0.62 (*)    All other components within normal limits  LIPASE, BLOOD  URINALYSIS, ROUTINE W REFLEX MICROSCOPIC (NOT AT [facility])  I-STAT BETA HCG BLOOD, ED ([facility], [facility], [facility] ONLY)    Imaging Review No results found. I have personally reviewed and evaluated these images and lab results as part of my medical decision-making.   EKG Interpretation None      MDM   Final diagnoses:  None   Patient with pleuritic left lower chest/upper abdominal pain. No other associated GI symptoms. Pain is worse with breathing. Considered PE, patient is slightly tachycardic at 106, on oral contraceptives. Will get a d-dimer. Will get labs. Will try GI cocktail.  1:19  AM No improvement with GI cocktail. D-dimer mildly elevate dat 0.62. Will get a CT angiogram of the chest and extended through the abdomen for evaluation of her left upper quadrant/left lower chest pain.   Patient signed out at shift change to [facility]. Pending CT. If negative patient is okay to be discharged home with close outpatient follow-up.    Jaynie Crumbleatyana Jannice Beitzel, PA-C 03/08/16 [id number]  Tomasita CrumbleAdeleke Oni, MD 03/08/16 [phone]

## 2016-03-18 DIAGNOSIS — R109 Unspecified abdominal pain: Secondary | ICD-10-CM | POA: Diagnosis not present

## 2016-04-23 MED FILL — NUVARING VAGINAL RING: 0.12-0.015 | 84 days supply | Qty: 3 | Fill #3

## 2016-04-25 ENCOUNTER — Ambulatory Visit (INDEPENDENT_AMBULATORY_CARE_PROVIDER_SITE_OTHER): Payer: 59 | Admitting: Family Medicine

## 2016-04-25 VITALS — BP 100/78 | HR 101 | Temp 98.8°F | Resp 16 | Ht 67.5 in | Wt 134.4 lb

## 2016-04-25 DIAGNOSIS — J302 Other seasonal allergic rhinitis: Secondary | ICD-10-CM

## 2016-04-25 DIAGNOSIS — J309 Allergic rhinitis, unspecified: Secondary | ICD-10-CM | POA: Diagnosis not present

## 2016-04-25 MED ORDER — PREDNISONE 20 MG PO TABS: ORAL_TABLET | ORAL | Status: DC

## 2016-04-25 MED ORDER — FLUTICASONE PROPIONATE 50 MCG/ACT NA SUSP: 2.0000 | Freq: Every day | NASAL | Status: DC

## 2016-04-25 MED FILL — FLUTICASONE PROP 50 MCG SPR: 50 | 30 days supply | Qty: 16 | Fill #0

## 2016-04-25 MED FILL — predniSONE 20 MG TABS: 20 | 3 days supply | Qty: 6 | Fill #0

## 2016-04-25 NOTE — Patient Instructions (Signed)
Drink plenty of fluids to stay well hydrated which will keep the secretions thinner  Take prednisone 20 mg 2 tablets daily for 3 days to try and open up the sinuses  Use fluticasone nose spray 2 sprays each nostril twice daily for 5 days, then decrease to once daily  Take an over-the-counter antihistamine such as Claritin, Allegra, or Zyrtec (loratadine, fexofenadine, or cetirizine)  If needed take an over-the-counter DM-containing cough syrup. However I suspect that you will feel much better quickly to get the sinuses open.  Return as needed

## 2016-04-25 NOTE — Progress Notes (Signed)
Patient ID: Valerie Sherman, female    DOB: 1991/11/09  Age: 25 y.o. MRN: [id number]  Chief Complaint  Patient presents with  . SINUS PRESSUE  . Sore Throat  . 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 She has been having symptoms for 2 or 3 days of upper respiratory congestion, facial pain especially right maxillary region, sore throat, and some cough. She's not been febrile. She is currently on her menstrual cycle. She is not on regular medications. Has not had a lot of seasonal allergy problems in the past.  Current allergies, medications, problem list, past/family and social histories reviewed.  Objective:  BP 100/78 mmHg  Pulse 101  Temp(Src) 98.8 F (37.1 C) (Oral)  Resp 16  Ht 5' 7.5" (1.715 m)  Wt 134 lb 6.4 oz (60.963 kg)  BMI 20.73 kg/m2  SpO2 99%  LMP 04/25/2016  No major acute distress. She does have a slight cough at times and sniffles some. Her TMs are normal. Tender right maxillary sinus primarily. Nose little congested. Throat not erythematous. Neck supple without significant nodes. Chest is clear to auscultation. Heart regular without murmur.  Assessment & Plan:   Assessment: 1. Allergic sinusitis   2. Seasonal allergies       Plan: Allergic sinusitis and allergies, will treat symptomatically.  No orders of the defined types were placed in this encounter.    Meds ordered this encounter  Medications  . fluticasone (FLONASE) 50 MCG/ACT nasal spray    Sig: Place 2 sprays into both nostrils daily.    Dispense:  16 g    Refill:  6  . predniSONE (DELTASONE) 20 MG tablet    Sig: Take 2 daily for 3 days for sinuses    Dispense:  6 tablet    Refill:  0         Patient Instructions  Drink plenty of fluids to stay well hydrated which will keep the secretions thinner  Take prednisone 20 mg 2 tablets daily for 3 days to try and open up the sinuses  Use fluticasone nose spray 2 sprays each nostril twice daily for 5 days, then decrease  to once daily  Take an over-the-counter antihistamine such as Claritin, Allegra, or Zyrtec (loratadine, fexofenadine, or cetirizine)  If needed take an over-the-counter DM-containing cough syrup. However I suspect that you will feel much better quickly to get the sinuses open.  Return as needed     Return if symptoms worsen or fail to improve.   Charice Zuno, MD 04/25/2016 2

## 2016-06-20 MED FILL — NUVARING VAGINAL RING: 0.12-0.015 | 84 days supply | Qty: 3 | Fill #4

## 2016-06-20 MED FILL — ESCITALOPRAM 20 MG TABLET: 20 | 30 days supply | Qty: 30 | Fill #2

## 2016-09-03 MED FILL — ESCITALOPRAM 20 MG TABLET: 20 | 30 days supply | Qty: 30 | Fill #3

## 2016-09-20 DIAGNOSIS — F321 Major depressive disorder, single episode, moderate: Secondary | ICD-10-CM | POA: Diagnosis not present

## 2016-09-20 DIAGNOSIS — Z23 Encounter for immunization: Secondary | ICD-10-CM | POA: Diagnosis not present

## 2016-09-20 MED FILL — BUPROPION HCL XL 150 MG TAB: 150 | 30 days supply | Qty: 30 | Fill #0

## 2016-10-22 MED FILL — NUVARING VAGINAL RING: 0.12-0.015 | 28 days supply | Qty: 1 | Fill #0

## 2016-10-22 MED FILL — ESCITALOPRAM 20 MG TABLET: 20 | 30 days supply | Qty: 30 | Fill #4

## 2016-10-28 DIAGNOSIS — H5213 Myopia, bilateral: Secondary | ICD-10-CM | POA: Diagnosis not present

## 2016-11-30 DIAGNOSIS — R51 Headache: Secondary | ICD-10-CM | POA: Diagnosis not present

## 2016-12-10 MED FILL — NUVARING VAGINAL RING: 0.12-0.015 | 60 days supply | Qty: 2 | Fill #0

## 2017-01-20 ENCOUNTER — Other Ambulatory Visit: Payer: Self-pay | Admitting: Family Medicine

## 2017-01-20 DIAGNOSIS — F411 Generalized anxiety disorder: Secondary | ICD-10-CM

## 2017-01-20 DIAGNOSIS — F329 Major depressive disorder, single episode, unspecified: Secondary | ICD-10-CM

## 2017-01-20 DIAGNOSIS — F32A Depression, unspecified: Secondary | ICD-10-CM

## 2017-01-22 MED FILL — ESCITALOPRAM 20 MG TABLET: 20 | 90 days supply | Qty: 90 | Fill #0

## 2017-02-18 MED FILL — NUVARING VAGINAL RING: 0.12-0.015 | 84 days supply | Qty: 3 | Fill #0

## 2017-04-09 IMAGING — CT CT ABDOMEN W/ CM
4 of 16 series · 14 of 47 positions shown, 18 images · IV contrast (100 ML OMNI 300)
Comparison: None.

CLINICAL DATA: Pleuritic chest pain and left upper quadrant pain.

EXAM:
CT ANGIOGRAPHY CHEST
CT ABDOMEN WITH CONTRAST
TECHNIQUE: Multidetector CT imaging of the chest was performed using the
standard protocol during bolus administration of intravenous
contrast. Multiplanar CT image reconstructions and MIPs were
obtained to evaluate the vascular anatomy. Multidetector CT imaging
of the abdomen was performed using the standard protocol during
bolus administration of intravenous contrast.
CONTRAST:  100mL OMNIPAQUE IOHEXOL 350 MG/ML SOLN

[Series 5: pe st · axial · 0.55mm/px · z∈[+1500,+1584]mm · 3 of 58 slices shown]
[im 15/58  soft-tissue]
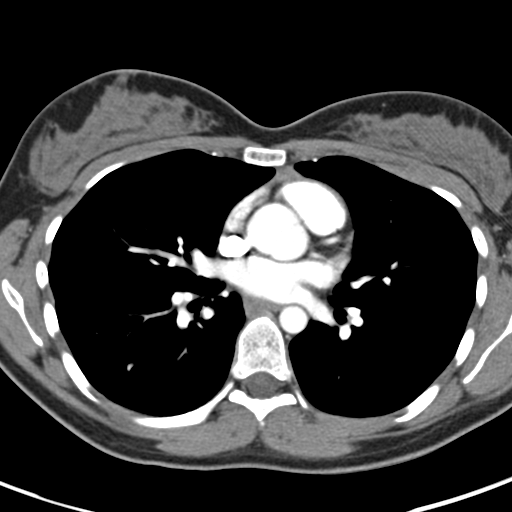
[im 29/58  soft-tissue]
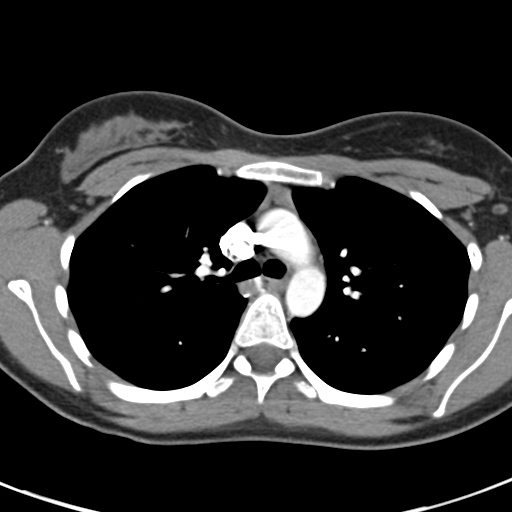
[im 43/58  soft-tissue]
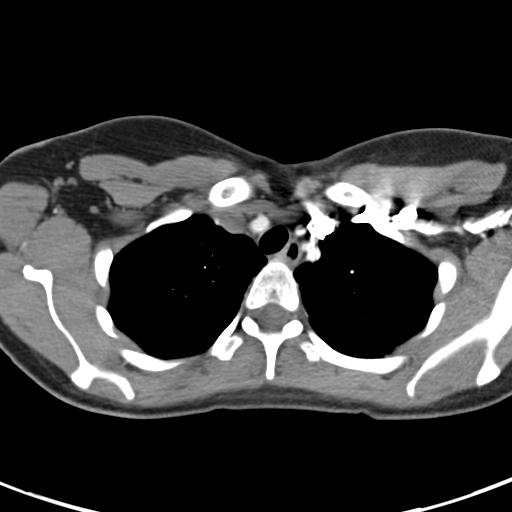

[Series 7: thins for pacs · axial · 0.55mm/px · z∈[+1470,+1614]mm · 8 of 174 slices shown]
[im 15/174  soft-tissue]
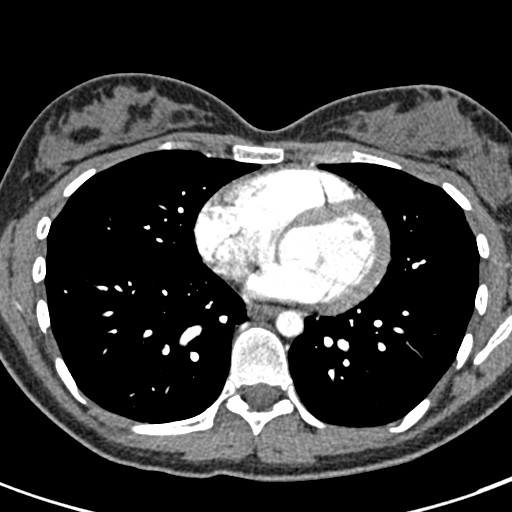
[im 44/174  soft-tissue]
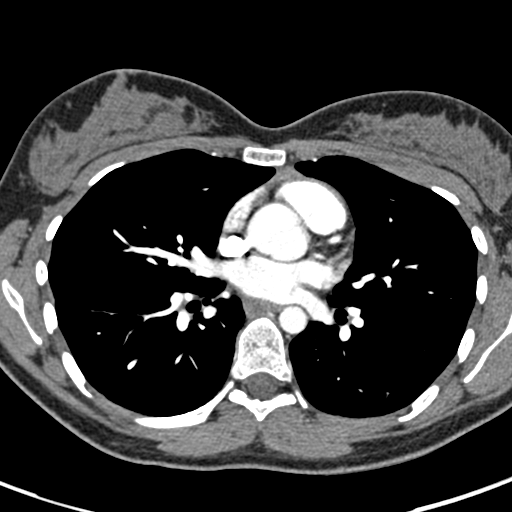
[im 58/174  soft-tissue]
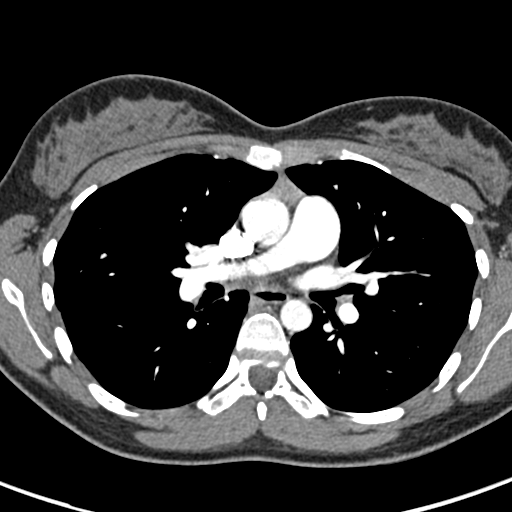
[im 73/174  soft-tissue]
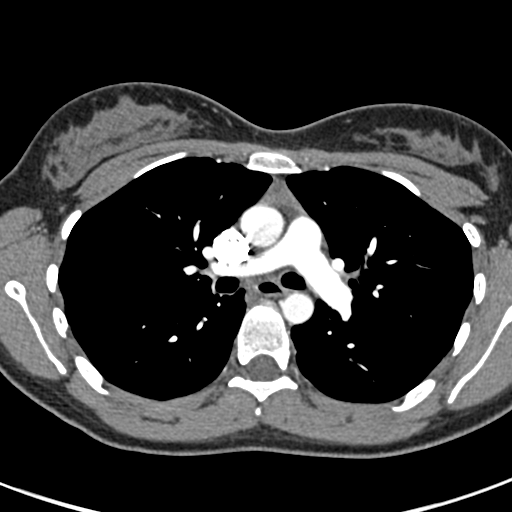
[im 101/174  soft-tissue]
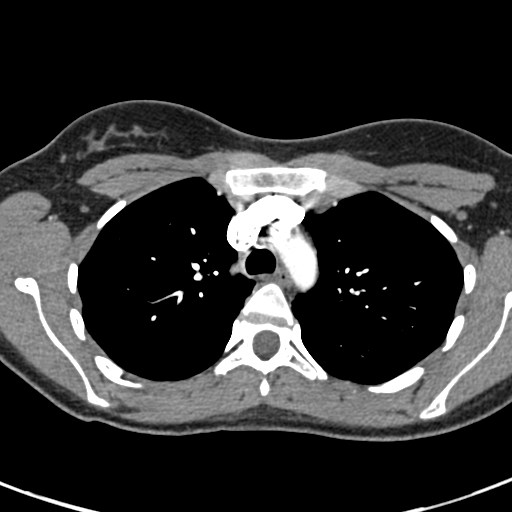
[im 116/174  soft-tissue]
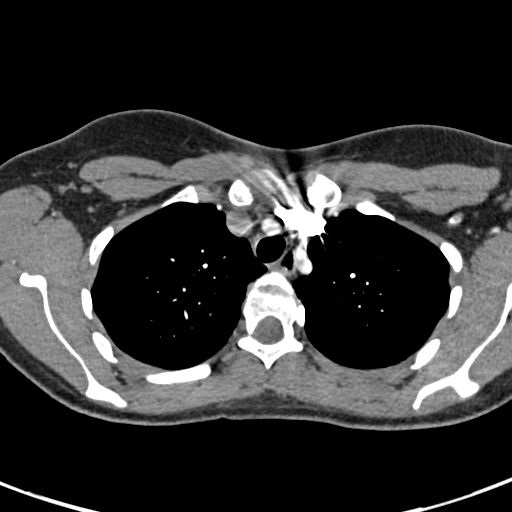
[im 130/174  soft-tissue]
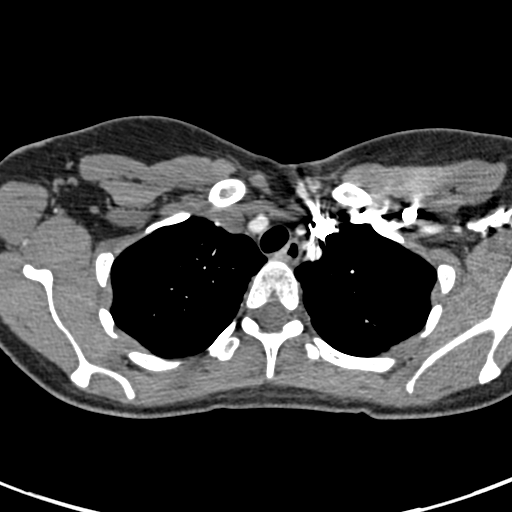
[im 159/174  soft-tissue]
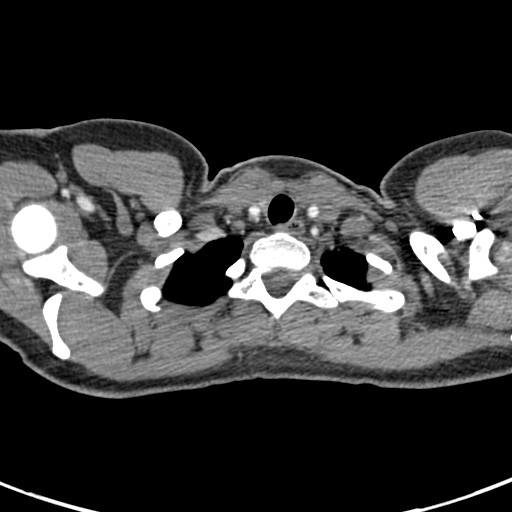

[Series 11: coronal mpr · coronal · 0.34mm/px · 1 of 88 slices shown, 2 images]
[im 44/88  soft-tissue]
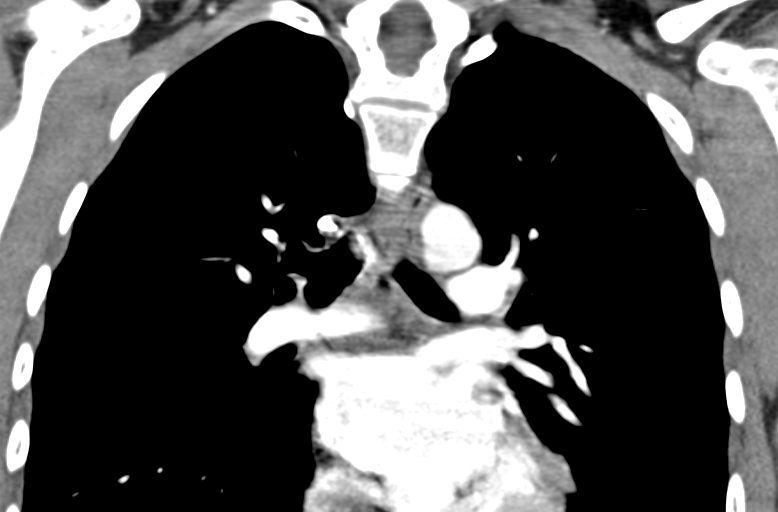
[im 44/88  bone]
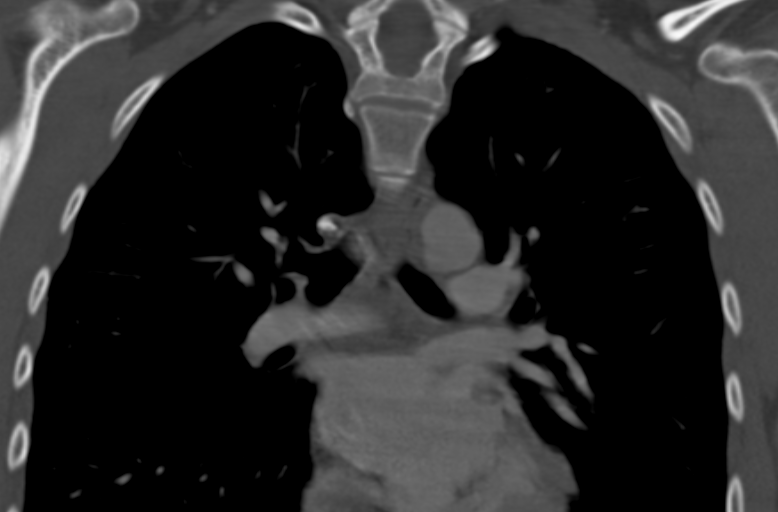

[Series 18: abd with · axial · 0.62mm/px · z∈[+1266,+1360]mm · 2 of 57 slices shown, 5 images]
[im 19/57  soft-tissue]
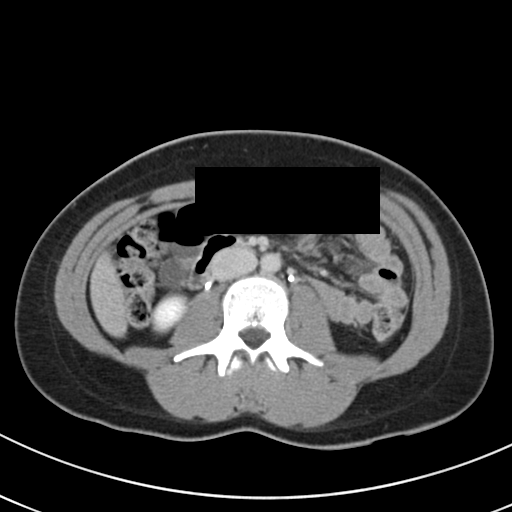
[im 19/57  lung]
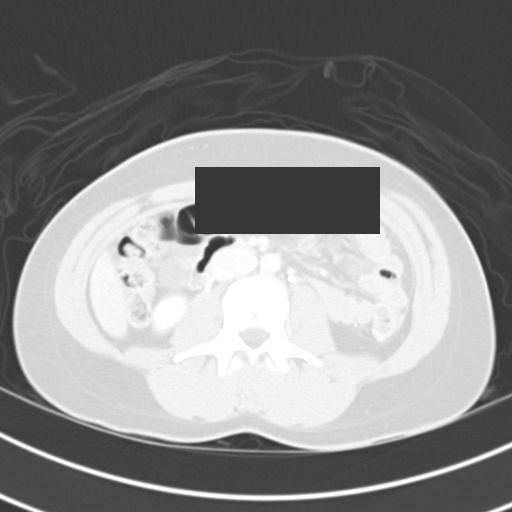
[im 19/57  bone]
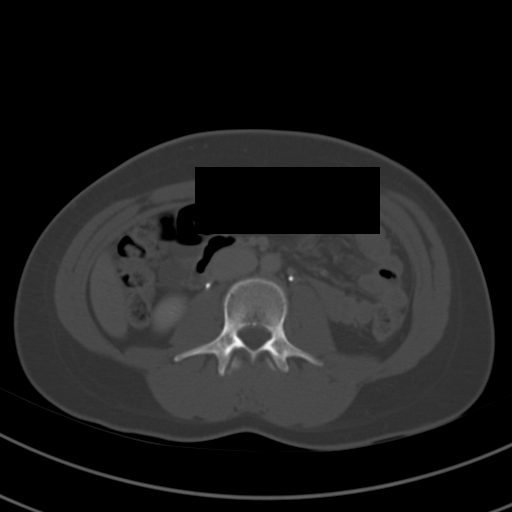
[im 38/57  soft-tissue]
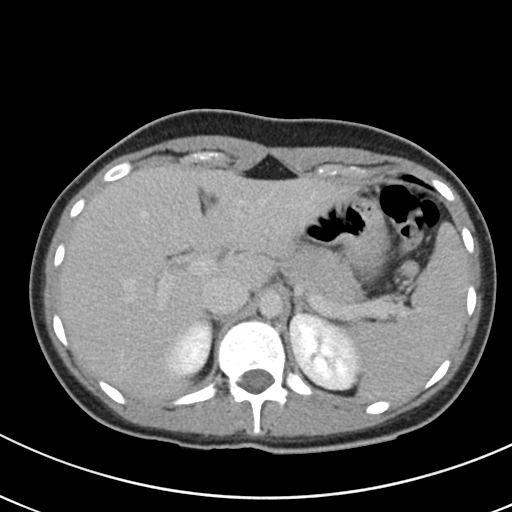
[im 38/57  lung]
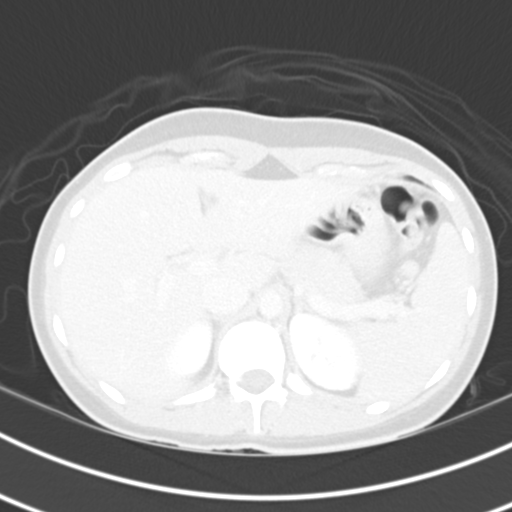

[14 of 47 positions shown; findings below may reference images not displayed]

FINDINGS: CTA CHEST FINDINGS

Technically adequate study with good opacification of the central
and segmental pulmonary arteries. No focal filling defects. No
evidence of significant pulmonary embolus.

Normal heart size. Normal caliber thoracic aorta. No aortic
dissection. Great vessel origins are patent. Esophagus is
decompressed. No significant lymphadenopathy in the chest.

The lungs are clear. No focal airspace disease or consolidation. No
pleural effusions. No pneumothorax. Airways are patent.

CT ABDOMEN FINDINGS

The liver, spleen, gallbladder, pancreas, adrenal glands, kidneys,
abdominal aorta, inferior vena cava, and retroperitoneal lymph nodes
are unremarkable. Visualized stomach, small bowel, and colon are not
abnormally distended. There is mild focal infiltration in the
abdominal fatty inferior to the spleen extending to the left
pericolic gutter. This may represent inflammatory process but no
specific etiology is identified. Normal alignment of the spine. No
destructive bone lesions.

Review of the MIP images confirms the above findings.
IMPRESSION: No evidence of significant pulmonary embolus. No evidence of active
pulmonary disease.

Slight infiltration in the abdominal fatty inferior to the spleen
along the left pericolic gutter. This may represent inflammatory
reaction but etiology is indeterminate.

## 2017-06-05 MED FILL — NUVARING VAGINAL RING: 0.12-0.015 | 84 days supply | Qty: 3 | Fill #1

## 2017-07-10 MED FILL — KETOROLAC 10 MG TABLET: 10 | 5 days supply | Qty: 20 | Fill #0

## 2017-07-21 ENCOUNTER — Encounter: Payer: Self-pay | Admitting: Neurology

## 2017-07-21 ENCOUNTER — Ambulatory Visit (INDEPENDENT_AMBULATORY_CARE_PROVIDER_SITE_OTHER): Payer: BC Managed Care – PPO | Admitting: Neurology

## 2017-07-21 DIAGNOSIS — G43019 Migraine without aura, intractable, without status migrainosus: Secondary | ICD-10-CM | POA: Diagnosis not present

## 2017-07-21 HISTORY — DX: Migraine without aura, intractable, without status migrainosus: G43.019

## 2017-07-21 MED ORDER — ONDANSETRON 8 MG PO TBDP: ORAL_TABLET | ORAL | 3 refills | Status: AC

## 2017-07-21 MED ORDER — NARATRIPTAN HCL 2.5 MG PO TABS
2.5000 mg | ORAL_TABLET | Freq: Two times a day (BID) | ORAL | 5 refills | Status: DC | PRN
Start: 2017-07-21 — End: 2019-07-05

## 2017-07-21 NOTE — Progress Notes (Signed)
Reason for visit: Migraine headache  Referring physician: Dr. Hiram Comberhongteum  Valerie Sherman is a 26 y.o. female  History of present illness:  Valerie Sherman is a 26 year old right-handed white female with a history of migraine headaches. The patient usually has mild headaches that may occur 2 or 3 times a month and are easily controlled with Advil. Beginning in December 2017, the patient began having more severe headaches that are retro-orbital in nature associated with nausea and vomiting, photophobia and phonophobia. The patient indicates that her headaches typically will last anywhere from 12 hours to frequently up to 2 or 3 days. The patient usually gets her headaches on a Friday evening, she generally does not miss work because of the headache. The patient denies any other activators for her headache. She is taking Zofran for the nausea and vomiting. She has taken Imitrex 50 mg tablets with some benefit, but the headaches tend to rebound and she has to re-dose the medication. A Toradol injection did help the headache, Toradol tablets do not help. The patient has not had any change in birth control dosing recently. She denies a family history of headache. She is sent to this office for an evaluation. She may get some blurred vision with the headaches, she has mild cognitive clouding with the headache.  Past Medical History:  Diagnosis Date  . Anxiety   . Chronic tonsillitis 07/2015  . Depression   . Severe needle phobia     Past Surgical History:  Procedure Laterality Date  . TONSILLECTOMY N/A 08/04/2015   Procedure: TONSILLECTOMY;  Surgeon: Suzanna ObeyJohn Byers, MD;  Location: [facility];  Service: ENT;  Laterality: N/A;  . WISDOM TOOTH EXTRACTION     x4    Family History  Problem Relation Age of Onset  . Cancer Father   . Cancer Maternal Grandfather   . Heart disease Paternal Grandfather     Social history:  reports that she has never smoked. She has never used smokeless  tobacco. She reports that she drinks about 1.2 oz of alcohol per week . She reports that she does not use drugs.  Medications:  Prior to Admission medications   Medication Sig Start Date End Date Taking? Authorizing Provider  escitalopram (LEXAPRO) 20 MG tablet Take 1 tablet (20 mg total) by mouth daily. 11/15/15  Yes Peyton NajjarHopper, David H, MD  etonogestrel-ethinyl estradiol (NUVARING) 0.12-0.015 MG/24HR vaginal ring Place 1 each vaginally every 28 (twenty-eight) days. Insert vaginally and leave in place for 3 consecutive weeks, then remove for 1 week.   Yes [provider]  hydrOXYzine (ATARAX/VISTARIL) 10 MG tablet Take 10 mg by mouth as needed.   Yes [provider]  ketorolac (TORADOL) 10 MG tablet TAKE 1 TABLET BY MOUTH EVERY 6 HOURS WITH FOOD OR MILK AS NEEDED FOR 5 DAYS 07/10/17  Yes [provider]  ondansetron (ZOFRAN-ODT) 8 MG disintegrating tablet DISSOLVE 1 TABLET ON THE TONGUE 3 TIMES A DAY AS NEEDED FOR NAUSEA 05/08/17  Yes [provider]  SUMAtriptan (IMITREX) 50 MG tablet TAKE 1 TABLET BY MOUTH FOR SEVERE HEADACHE, CAN REPEAT IN 2HRS 05/08/17  Yes [provider]      Allergies  Allergen Reactions  . Augmentin [Amoxicillin-Pot Clavulanate] Nausea And Vomiting  . Oxycodone Palpitations    dizziness    ROS:  Out of a complete 14 system review of symptoms, the patient complains only of the following symptoms, and all other reviewed systems are negative.  Fevers, chills Dizziness  Headache, dizziness Depression, anxiety, too much sleep, and enough sleep, decreased energy, disinterest in activities Insomnia  Blood pressure 132/90, pulse 79, height 5\' 8"  (1.727 m), weight 146 lb 8 oz (66.5 kg).  Physical Exam  General: The patient is alert and cooperative at the time of the examination.  Eyes: Pupils are equal, round, and reactive to light. Discs are flat bilaterally.  Neck: The neck is supple, no carotid bruits are  noted.  Respiratory: The respiratory examination is clear.  Cardiovascular: The cardiovascular examination reveals a regular rate and rhythm, no obvious murmurs or rubs are noted.  Skin: Extremities are without significant edema.  Neurologic Exam  Mental status: The patient is alert and oriented x 3 at the time of the examination. The patient has apparent normal recent and remote memory, with an apparently normal attention span and concentration ability.  Cranial nerves: Facial symmetry is present. There is good sensation of the face to pinprick and soft touch bilaterally. The strength of the facial muscles and the muscles to head turning and shoulder shrug are normal bilaterally. Speech is well enunciated, no aphasia or dysarthria is noted. Extraocular movements are full. Visual fields are full. The tongue is midline, and the patient has symmetric elevation of the soft palate. No obvious hearing deficits are noted.  Motor: The motor testing reveals 5 over 5 strength of all 4 extremities. Good symmetric motor tone is noted throughout.  Sensory: Sensory testing is intact to pinprick, soft touch, vibration sensation, and position sense on all 4 extremities. No evidence of extinction is noted.  Coordination: Cerebellar testing reveals good finger-nose-finger and heel-to-shin bilaterally.  Gait and station: Gait is normal. Tandem gait is normal. Romberg is negative. No drift is seen.  Reflexes: Deep tendon reflexes are symmetric and normal bilaterally. Toes are downgoing bilaterally.   Assessment/Plan:  1. Common migraine headache  The patient is having headaches during the "let down period". The patient is having only one or 2 headaches a month, but the headaches may last 2 or 3 days. The patient will be taken off of Imitrex and placed on Amerge 2.5 mg tablets taking 1 twice daily for 2 days after onset of the headache. She can take Advil if needed, a prescription was given for Zofran for  the nausea. The patient will contact me if she is not doing well, we will add a daily prophylactic medication at that time. He will follow-up in 3 months.  Marlan Palau MD 07/21/2017 10:18 AM  [facility] [address] [place], [place] [address]  Phone [phone] Fax [phone]

## 2017-08-08 MED FILL — NARATRIPTAN HCL 2.5 MG TAB: 2.5 | 30 days supply | Qty: 9 | Fill #0

## 2017-08-08 MED FILL — ESCITALOPRAM 20 MG TABLET: 20 | 90 days supply | Qty: 90 | Fill #1

## 2017-08-08 MED FILL — ONDANSETRON ODT 8 MG TABLET: 8 | 21 days supply | Qty: 18 | Fill #0

## 2017-08-29 MED FILL — NUVARING VAGINAL RING: 0.12-0.015 | 27 days supply | Qty: 1 | Fill #2

## 2017-09-26 MED FILL — NUVARING VAGINAL RING: 0.12-0.015 | 28 days supply | Qty: 1 | Fill #3

## 2017-10-14 ENCOUNTER — Ambulatory Visit: Payer: BC Managed Care – PPO | Admitting: Adult Health

## 2017-12-24 MED FILL — NORETHINDRONE 0.35 MG TAB: 0.35 | 28 days supply | Qty: 28 | Fill #0

## 2018-01-26 MED FILL — NORETHINDRONE 0.35 MG TAB: 0.35 | 28 days supply | Qty: 28 | Fill #1

## 2018-02-04 MED FILL — ESCITALOPRAM 20 MG TABLET: 20 | 90 days supply | Qty: 90 | Fill #0

## 2018-03-27 MED FILL — NORETHINDRONE 0.35 MG TAB: 0.35 | 28 days supply | Qty: 28 | Fill #0

## 2018-04-29 ENCOUNTER — Other Ambulatory Visit: Payer: Self-pay | Admitting: Obstetrics and Gynecology

## 2018-04-29 ENCOUNTER — Other Ambulatory Visit (HOSPITAL_COMMUNITY)
Admission: RE | Admit: 2018-04-29 | Discharge: 2018-04-29 | Disposition: A | Discharge status: Home / Self Care | Payer: 59 | Source: Ambulatory Visit | Attending: Obstetrics and Gynecology | Admitting: Obstetrics and Gynecology

## 2018-04-29 DIAGNOSIS — Z01419 Encounter for gynecological examination (general) (routine) without abnormal findings: Secondary | ICD-10-CM | POA: Insufficient documentation

## 2018-05-01 LAB — CYTOLOGY - PAP: Diagnosis: NEGATIVE

## 2018-06-10 MED FILL — PROMETHAZINE-DM SYRUP: 6.25-15 | 7 days supply | Qty: 210 | Fill #0

## 2018-06-10 MED FILL — predniSONE 10 MG TABS: 10 | 5 days supply | Qty: 20 | Fill #0

## 2018-06-10 MED FILL — miSOPROStol 200 MCG TABS: 200 | 2 days supply | Qty: 2 | Fill #0

## 2018-07-29 MED FILL — ESCITALOPRAM 20 MG TABLET: 20 | 90 days supply | Qty: 90 | Fill #1

## 2018-09-16 ENCOUNTER — Telehealth: Payer: Self-pay | Admitting: Family

## 2018-09-16 DIAGNOSIS — J019 Acute sinusitis, unspecified: Secondary | ICD-10-CM

## 2018-09-16 MED ORDER — DOXYCYCLINE HYCLATE 100 MG PO TABS: 100.0000 mg | ORAL_TABLET | Freq: Two times a day (BID) | ORAL | 0 refills | Status: DC

## 2018-09-16 NOTE — Progress Notes (Signed)
We are sorry that you are not feeling well.  Here is how we plan to help!  Based on what you have shared with me it looks like you have sinusitis.  Sinusitis is inflammation and infection in the sinus cavities of the head.  Based on your presentation I believe you most likely have Acute Bacterial Sinusitis.  This is an infection caused by bacteria and is treated with antibiotics. I have prescribed Doxycycline 100mg  by mouth twice a day for 10 days. You may use an oral decongestant such as Mucinex D or if you have glaucoma or high blood pressure use plain Mucinex. Saline nasal spray help and can safely be used as often as needed for congestion.  If you develop worsening sinus pain, fever or notice severe headache and vision changes, or if symptoms are not better after completion of antibiotic, please schedule an appointment with a health care provider.    I have also sent in a note for work through your MyChart that you can print.   Sinus infections are not as easily transmitted as other respiratory infection, however we still recommend that you avoid close contact with loved ones, especially the very young and elderly.  Remember to wash your hands thoroughly throughout the day as this is the number one way to prevent the spread of infection!  Home Care:  Only take medications as instructed by your medical team.  Complete the entire course of an antibiotic.  Do not take these medications with alcohol.  A steam or ultrasonic humidifier can help congestion.  You can place a towel over your head and breathe in the steam from hot water coming from a faucet.  Avoid close contacts especially the very young and the elderly.  Cover your mouth when you cough or sneeze.  Always remember to wash your hands.  Get Help Right Away If:  You develop worsening fever or sinus pain.  You develop a severe head ache or visual changes.  Your symptoms persist after you have completed your treatment  plan.  Make sure you  Understand these instructions.  Will watch your condition.  Will get help right away if you are not doing well or get worse.  Your e-visit answers were reviewed by a board certified advanced clinical practitioner to complete your personal care plan.  Depending on the condition, your plan could have included both over the counter or prescription medications.  If there is a problem please reply  once you have received a response from your provider.  Your safety is important to us.  If you have drug allergies check your prescription carefully.    You can use MyChart to ask questions about today's visit, request a non-urgent call back, or ask for a work or school excuse for 24 hours related to this e-Visit. If it has been greater than 24 hours you will need to follow up with your provider, or enter a new e-Visit to address those concerns.  You will get an e-mail in the next two days asking about your experience.  I hope that your e-visit has been valuable and will speed your recovery. Thank you for using e-visits.

## 2019-04-03 ENCOUNTER — Telehealth: Payer: Self-pay | Admitting: Physician Assistant

## 2019-04-03 DIAGNOSIS — M549 Dorsalgia, unspecified: Secondary | ICD-10-CM

## 2019-04-03 NOTE — Progress Notes (Signed)
Based on what you shared with me, I feel your condition warrants further evaluation and I recommend that you be seen for a face to face office visit.  Giving the back pain is not improving with the measures tried and there is severe pain with radiation into the leg, I would recommend you be seen to have a good musculoskeletal exam and steroid shot to calm down the nerve inflammation present. I feel this will be the quickest way to get you feeling better.    NOTE: If you entered your credit card information for this eVisit, you will not be charged. You may see a "hold" on your card for the $35 but that hold will drop off and you will not have a charge processed.  If you are having a true medical emergency please call 911.  If you need an urgent face to face visit, [place] has four urgent care centers for your convenience.    PLEASE NOTE: THE INSTACARE LOCATIONS AND URGENT CARE CLINICS DO NOT HAVE THE TESTING FOR CORONAVIRUS COVID19 AVAILABLE.  IF YOU FEEL YOU NEED THIS TEST YOU MUST GO TO A TRIAGE LOCATION AT ONE OF THE HOSPITAL EMERGENCY DEPARTMENTS   [email or web address] to reserve your spot online an avoid wait times  [facility] [address] [place], [place] [address] Modified hours of operation: Monday-Friday, 10 AM to 6 PM  Saturday & Sunday 10 AM to 4 PM *Across the street from [employer]  [employer] (New Address!) [address] [place], [place] [address] *Just off [employer], across the road from [place]* Modified hours of operation: Monday-Friday, 10 AM to 5 PM  Closed Saturday & Sunday   The following sites will take your insurance:  . [facility]  [phone] Get Driving Directions Find a Provider at this Location  [address] [place], [place] [address] . 10 am to 8 pm Monday-Friday . 12 pm to 8 pm Saturday-Sunday   . [facility] at [facility]   [phone] Get Driving Directions Find a Provider at this Location  [address], [address] [place], [place] [address] . 8 am to 8 pm Monday-Friday . 9 am to 6 pm Saturday . 11 am to 6 pm Sunday   . [facility] at [facility]  [phone] Get Driving Directions  [address] [place], [place] [address] . 8 am to 8 pm Monday-Friday . 8 am to 4 pm Saturday-Sunday   Your e-visit answers were reviewed by a board certified advanced clinical practitioner to complete your personal care plan.  Thank you for using e-Visits.

## 2019-07-05 ENCOUNTER — Ambulatory Visit: Payer: BC Managed Care – PPO | Admitting: Adult Health

## 2019-07-05 ENCOUNTER — Other Ambulatory Visit: Payer: Self-pay

## 2019-07-05 ENCOUNTER — Encounter: Payer: Self-pay | Admitting: Adult Health

## 2019-07-05 ENCOUNTER — Telehealth: Payer: Self-pay | Admitting: Adult Health

## 2019-07-05 VITALS — BP 114/77 | HR 76 | Temp 97.3°F | Ht 68.0 in | Wt 150.4 lb

## 2019-07-05 DIAGNOSIS — G43019 Migraine without aura, intractable, without status migrainosus: Secondary | ICD-10-CM

## 2019-07-05 MED ORDER — NARATRIPTAN HCL 2.5 MG PO TABS: ORAL_TABLET | ORAL | 5 refills | Status: DC

## 2019-07-05 NOTE — Progress Notes (Signed)
PATIENT: Valerie Sherman DOB: October 27, 1991  REASON FOR VISIT: follow up HISTORY FROM: patient  HISTORY OF PRESENT ILLNESS: Today 07/05/19:  Valerie Sherman is a 28 year old female with a history of migraine headaches.  She returns today for follow-up.  She states that she has approximately 1 headache every 2 to 3 weeks.  It always occurs behind the right eye.  She reports nausea photophobia and phonophobia with her headaches.  She states typically the right eye gets watery.  Denies nasal congestion.  Denies redness of the eyes.  She states that typically she has to lay down and the headache will typically resolve in 2 to 3 days.  On occasion she will go to urgent care and received a Toradol injection.  On a scale of 1-10 her pain is typically a 7 or 8.  In the past she has been on Amerge and reports that this worked well for her headaches.  HISTORY Valerie Sherman is a 28 year old right-handed white female with a history of migraine headaches. The patient usually has mild headaches that may occur 2 or 3 times a month and are easily controlled with Advil. Beginning in December 2017, the patient began having more severe headaches that are retro-orbital in nature associated with nausea and vomiting, photophobia and phonophobia. The patient indicates that her headaches typically will last anywhere from 12 hours to frequently up to 2 or 3 days. The patient usually gets her headaches on a Friday evening, she generally does not miss work because of the headache. The patient denies any other activators for her headache. She is taking Zofran for the nausea and vomiting. She has taken Imitrex 50 mg tablets with some benefit, but the headaches tend to rebound and she has to re-dose the medication. A Toradol injection did help the headache, Toradol tablets do not help. The patient has not had any change in birth control dosing recently. She denies a family history of headache. She is sent to this office for an evaluation. She  may get some blurred vision with the headaches, she has mild cognitive clouding with the headache.   REVIEW OF SYSTEMS: Out of a complete 14 system review of symptoms, the patient complains only of the following symptoms, and all other reviewed systems are negative.  Dizziness, headache  ALLERGIES: Allergies  Allergen Reactions   Augmentin [Amoxicillin-Pot Clavulanate] Nausea And Vomiting   Oxycodone Palpitations    dizziness    HOME MEDICATIONS: Outpatient Medications Prior to Visit  Medication Sig Dispense Refill   ketorolac (TORADOL) 10 MG tablet TAKE 1 TABLET BY MOUTH EVERY 6 HOURS WITH FOOD OR MILK AS NEEDED FOR 5 DAYS  0   levonorgestrel (MIRENA) 20 MCG/24HR IUD 1 each by Intrauterine route once.     ondansetron (ZOFRAN-ODT) 8 MG disintegrating tablet DISSOLVE 1 TABLET ON THE TONGUE 3 TIMES A DAY AS NEEDED FOR NAUSEA 30 tablet 3   doxycycline (VIBRA-TABS) 100 MG tablet Take 1 tablet (100 mg total) by mouth 2 (two) times daily. 20 tablet 0   escitalopram (LEXAPRO) 20 MG tablet Take 1 tablet (20 mg total) by mouth daily. (Patient not taking: Reported on 07/05/2019) 30 tablet 5   etonogestrel-ethinyl estradiol (NUVARING) 0.12-0.015 MG/24HR vaginal ring Place 1 each vaginally every 28 (twenty-eight) days. Insert vaginally and leave in place for 3 consecutive weeks, then remove for 1 week.     hydrOXYzine (ATARAX/VISTARIL) 10 MG tablet Take 10 mg by mouth as needed.     naratriptan (AMERGE) 2.5 MG  tablet Take 1 tablet (2.5 mg total) by mouth 2 (two) times daily as needed for migraine. 10 tablet 5   No facility-administered medications prior to visit.     PAST MEDICAL HISTORY: Past Medical History:  Diagnosis Date   Anxiety    Chronic tonsillitis 07/2015   Common migraine with intractable migraine 07/21/2017   Depression    Severe needle phobia     PAST SURGICAL HISTORY: Past Surgical History:  Procedure Laterality Date   TONSILLECTOMY N/A 08/04/2015    Procedure: TONSILLECTOMY;  Surgeon: Melissa Montane, MD;  Location: [facility];  Service: ENT;  Laterality: N/A;   WISDOM TOOTH EXTRACTION     x4    FAMILY HISTORY: Family History  Problem Relation Age of Onset   Cancer Father    Cancer Maternal Grandfather    Heart disease Paternal Grandfather     SOCIAL HISTORY: Social History   Socioeconomic History   Marital status: Single    Spouse name: Not on file   Number of children: 0   Years of education: BA   Highest education level: Not on file  Occupational History   Occupation: [employer]  Social Needs   Financial resource strain: Not on file   Food insecurity    Worry: Not on file    Inability: Not on file   Transportation needs    Medical: Not on file    Non-medical: Not on file  Tobacco Use   Smoking status: Never Smoker   Smokeless tobacco: Never Used  Substance and Sexual Activity   Alcohol use: Yes    Alcohol/week: 2.0 standard drinks    Types: 2 Standard drinks or equivalent per week    Comment: 2-3 drinks per week   Drug use: No   Sexual activity: Yes  Lifestyle   Physical activity    Days per week: Not on file    Minutes per session: Not on file   Stress: Not on file  Relationships   Social connections    Talks on phone: Not on file    Gets together: Not on file    Attends religious service: Not on file    Active member of club or organization: Not on file    Attends meetings of clubs or organizations: Not on file    Relationship status: Not on file   Intimate partner violence    Fear of current or ex partner: Not on file    Emotionally abused: Not on file    Physically abused: Not on file    Forced sexual activity: Not on file  Other Topics Concern   Not on file  Social History Narrative   Lives alone   Caffeine use: 4-5 soda per week   Right handed      PHYSICAL EXAM  Vitals:   07/05/19 0838  BP: 114/77  Pulse: 76  Temp: (!) 97.3 F (36.3  C)  Weight: 150 lb 6.4 oz (68.2 kg)  Height: 5\' 8"  (1.727 m)   Body mass index is 22.87 kg/m.  Generalized: Well developed, in no acute distress   Neurological examination  Mentation: Alert oriented to time, place, history taking. Follows all commands speech and language fluent Cranial nerve II-XII: Pupils were equal round reactive to light. Extraocular movements were full, visual field were full on confrontational test. Facial sensation and strength were normal. Uvula tongue midline. Head turning and shoulder shrug  were normal and symmetric. Motor: The motor testing reveals 5 over 5  strength of all 4 extremities. Good symmetric motor tone is noted throughout.  Sensory: Sensory testing is intact to soft touch on all 4 extremities. No evidence of extinction is noted.  Coordination: Cerebellar testing reveals good finger-nose-finger and heel-to-shin bilaterally.  Gait and station: Gait is normal.  Reflexes: Deep tendon reflexes are symmetric and normal bilaterally.   DIAGNOSTIC DATA (LABS, IMAGING, TESTING) - I reviewed patient records, labs, notes, testing and imaging myself where available.  Lab Results  Component Value Date   WBC 8.8 03/06/2016   HGB 11.4 (L) 03/06/2016   HCT 34.2 (L) 03/06/2016   MCV 84.0 03/06/2016   PLT 222 03/06/2016      Component Value Date/Time   NA 139 03/06/2016 1835   K 3.7 03/06/2016 1835   CL 107 03/06/2016 1835   CO2 22 03/06/2016 1835   GLUCOSE 94 03/06/2016 1835   BUN 10 03/06/2016 1835   CREATININE 0.58 03/06/2016 1835   CALCIUM 8.9 03/06/2016 1835   PROT 6.9 03/06/2016 1835   ALBUMIN 4.0 03/06/2016 1835   AST 17 03/06/2016 1835   ALT 10 (L) 03/06/2016 1835   ALKPHOS 48 03/06/2016 1835   BILITOT 0.5 03/06/2016 1835   GFRNONAA >60 03/06/2016 1835   GFRAA >60 03/06/2016 1835   No results found for: CHOL, HDL, LDLCALC, LDLDIRECT, TRIG, CHOLHDL No results found for: [id number] No results found for: VITAMINB12 No results found for:  TSH    ASSESSMENT AND PLAN 28 y.o. year old female  has a past medical history of Anxiety, Chronic tonsillitis (07/2015), Common migraine with intractable migraine (07/21/2017), Depression, and Severe needle phobia. here with:  1.  Migraine headache  The patient's headaches do sound like typical migraines.  She does have watery right eye which sometimes can be seen with cluster headaches however she does not have other typical symptoms of cluster headaches.  However we will keep this in mind if she does not respond to Amerge.  She will be restarted on Amerge.  Advised that she should take 1 tablet at the onset of a migraine and can repeat in 4 hours if needed.  If this is not beneficial for headache she will let us know.  I did caution that taking Amerge with trazodone can cause serotonin syndrome however this is rare.  Patient voiced understanding.  States that she will forego taking trazodone if she has to use Amerge.  I have advised that if her headaches worsen or she develops new symptoms she should let us know.  She will follow-up in 6 months or sooner if needed.   I spent 15 minutes with the patient. 50% of this time was spent discussing her headaches and treatment plan   Butch PennyMegan Jerlisa Diliberto, MSN, NP-C 07/05/2019, 8:54 AM [facility] [address], [address] [place], [place] [id number] [phone]

## 2019-07-05 NOTE — Patient Instructions (Signed)
Your Plan:  Restart Amerge take 1 tablet at the onset of migraine. Can repeat in 4 hours if needed. Not to exceed two doses in 24 hours.  If your symptoms worsen or you develop new symptoms please let us know.   Please be aware that Amerge and trazodone can sometimes cause a rare and potentially dangerous adverse reaction, called SEROTONIN SYNDROME: Symptoms of this condition include (but are not limited to):  Agitation or restlessness, confusion, rapid heart rate and high blood pressure, dilated pupils, loss of muscle coordination or twitching muscles, muscle rigidity/stiffness, sweating and/or flushing, diarrhea, headache, shivering, goose bumps. If you have any of these symptoms you may have to stop the medication. Call your health care provider immediately.  Severe serotonin syndrome can be life-threatening emergency. Signs and symptoms of a severe reaction may include: high fever, seizures, irregular heartbeat, unconsciousness or altered level of awareness or personality changes.  If you have any of these new symptoms, call 911 or have someone take you to the emergency room.    Thank you for coming to see [place] at [facility]. I hope we have been able to provide you high quality care today.  You may receive a patient satisfaction survey over the next few weeks. We would appreciate your feedback and comments so that we may continue to improve ourselves and the health of our patients.

## 2019-07-05 NOTE — Progress Notes (Signed)
I have read the note, and I agree with the clinical assessment and plan.  Charles K Willis   

## 2019-07-05 NOTE — Telephone Encounter (Signed)
Pt. Calling back to schedule 6 mo f/u

## 2019-10-25 ENCOUNTER — Other Ambulatory Visit: Payer: Self-pay | Admitting: *Deleted

## 2019-10-25 ENCOUNTER — Telehealth: Payer: Self-pay | Admitting: Physician Assistant

## 2019-10-25 DIAGNOSIS — Z20822 Contact with and (suspected) exposure to covid-19: Secondary | ICD-10-CM

## 2019-10-25 DIAGNOSIS — R112 Nausea with vomiting, unspecified: Secondary | ICD-10-CM

## 2019-10-25 DIAGNOSIS — R509 Fever, unspecified: Secondary | ICD-10-CM

## 2019-10-25 MED ORDER — ONDANSETRON 8 MG PO TBDP: 8.0000 mg | ORAL_TABLET | Freq: Three times a day (TID) | ORAL | 0 refills | Status: DC | PRN

## 2019-10-25 NOTE — Progress Notes (Signed)
E-Visit for Corona Virus Screening   Your current symptoms could be consistent with the coronavirus.  Many health care providers can now test patients at their office but not all are.  [facility] has multiple testing sites. For information on our COVID testing locations and hours go to [email or web address]  Please quarantine yourself while awaiting your test results.  We are enrolling you in our [facility] for [place] . Daily you will receive a questionnaire within the [facility] website. Our COVID 19 response team willl be monitoriing your responses daily.    COVID-19 is a respiratory illness with symptoms that are similar to the flu. Symptoms are typically mild to moderate, but there have been cases of severe illness and death due to the virus. The following symptoms may appear 2-14 days after exposure: . Fever . Cough . Shortness of breath or difficulty breathing . Chills . Repeated shaking with chills . Muscle pain . Headache . Sore throat . New loss of taste or smell . Fatigue . Congestion or runny nose . Nausea or vomiting . Diarrhea  It is vitally important that if you feel that you have an infection such as this virus or any other virus that you stay home and away from places where you may spread it to others.  You should self-quarantine for 14 days if you have symptoms that could potentially be coronavirus or have been in close contact a with a person diagnosed with COVID-19 within the last 2 weeks. You should avoid contact with people age 26 and older.   You should wear a mask or cloth face covering over your nose and mouth if you must be around other people or animals, including pets (even at home). Try to stay at least 6 feet away from other people. This will protect the people around you.  I have prescribed a medication that will help alleviate your symptoms and allow you to stay hydrated:  Zofran 4 mg 1 tablet every 8 hours as needed for  nausea and vomiting  You may also take acetaminophen (Tylenol) as needed for fever.  HOME CARE:  Drink clear liquids.  This is very important! Dehydration (the lack of fluid) can lead to a serious complication.  Start off with 1 tablespoon every 5 minutes for 8 hours.  You may begin eating bland foods after 8 hours without vomiting.  Start with saltine crackers, white bread, rice, mashed potatoes, applesauce.  After 48 hours on a bland diet, you may resume a normal diet.  Try to go to sleep.  Sleep often empties the stomach and relieves the need to vomit.   Reduce your risk of any infection by using the same precautions used for avoiding the common cold or flu:  Marland Kitchen Wash your hands often with soap and warm water for at least 20 seconds.  If soap and water are not readily available, use an alcohol-based hand sanitizer with at least 60% alcohol.  . If coughing or sneezing, cover your mouth and nose by coughing or sneezing into the elbow areas of your shirt or coat, into a tissue or into your sleeve (not your hands). . Avoid shaking hands with others and consider head nods or verbal greetings only. . Avoid touching your eyes, nose, or mouth with unwashed hands.  . Avoid close contact with people who are sick. . Avoid places or events with large numbers of people in one location, like concerts or sporting events. . Carefully consider travel plans you have or  are making. . If you are planning any travel outside or inside the [place], visit the CDC's Travelers' Health webpage for the latest health notices. . If you have some symptoms but not all symptoms, continue to monitor at home and seek medical attention if your symptoms worsen. . If you are having a medical emergency, call 911.  HOME CARE . Only take medications as instructed by your medical team. . Drink plenty of fluids and get plenty of rest. . A steam or ultrasonic humidifier can help if you have congestion.   GET HELP RIGHT AWAY IF YOU  HAVE EMERGENCY WARNING SIGNS** FOR COVID-19. If you or someone is showing any of these signs seek emergency medical care immediately. Call 911 or proceed to your closest emergency facility if: . You develop worsening high fever. . Trouble breathing . Bluish lips or face . Persistent pain or pressure in the chest . New confusion . Inability to wake or stay awake . You cough up blood. . Your symptoms become more severe  **This list is not all possible symptoms. Contact your medical provider for any symptoms that are sever or concerning to you.   MAKE SURE YOU   Understand these instructions.  Will watch your condition.  Will get help right away if you are not doing well or get worse.  Your e-visit answers were reviewed by a board certified advanced clinical practitioner to complete your personal care plan.  Depending on the condition, your plan could have included both over the counter or prescription medications.  If there is a problem please reply once you have received a response from your provider.  Your safety is important to [place].  If you have drug allergies check your prescription carefully.    You can use MyChart to ask questions about today's visit, request a non-urgent call back, or ask for a work or school excuse for 24 hours related to this e-Visit. If it has been greater than 24 hours you will need to follow up with your provider, or enter a new e-Visit to address those concerns. You will get an e-mail in the next two days asking about your experience.  I hope that your e-visit has been valuable and will speed your recovery. Thank you for using e-visits.   Greater than 5 minutes, yet less than 10 minutes of time have been spent researching, coordinating and implementing care for this patient today.

## 2019-10-26 LAB — NOVEL CORONAVIRUS, NAA: SARS-CoV-2, NAA: NOT DETECTED

## 2019-11-23 ENCOUNTER — Telehealth: Payer: Self-pay | Admitting: Physician Assistant

## 2019-11-23 DIAGNOSIS — N3000 Acute cystitis without hematuria: Secondary | ICD-10-CM

## 2019-11-23 MED ORDER — CEPHALEXIN 500 MG PO CAPS: 500.0000 mg | ORAL_CAPSULE | Freq: Two times a day (BID) | ORAL | 0 refills | Status: DC

## 2019-11-23 NOTE — Progress Notes (Signed)

## 2019-12-13 ENCOUNTER — Other Ambulatory Visit: Payer: Self-pay

## 2019-12-13 DIAGNOSIS — Z20822 Contact with and (suspected) exposure to covid-19: Secondary | ICD-10-CM

## 2019-12-14 LAB — NOVEL CORONAVIRUS, NAA: SARS-CoV-2, NAA: NOT DETECTED

## 2020-03-12 ENCOUNTER — Other Ambulatory Visit: Payer: Self-pay | Admitting: Adult Health

## 2020-09-13 ENCOUNTER — Other Ambulatory Visit: Payer: Self-pay

## 2020-09-13 DIAGNOSIS — Z20822 Contact with and (suspected) exposure to covid-19: Secondary | ICD-10-CM

## 2020-09-15 ENCOUNTER — Telehealth: Payer: BC Managed Care – PPO | Admitting: Emergency Medicine

## 2020-09-15 DIAGNOSIS — J069 Acute upper respiratory infection, unspecified: Secondary | ICD-10-CM

## 2020-09-15 LAB — SARS-COV-2, NAA 2 DAY TAT

## 2020-09-15 LAB — NOVEL CORONAVIRUS, NAA: SARS-CoV-2, NAA: NOT DETECTED

## 2020-09-15 MED ORDER — METHYLPREDNISOLONE 4 MG PO TBPK: ORAL_TABLET | ORAL | 0 refills | Status: DC

## 2020-09-15 MED ORDER — ALBUTEROL SULFATE HFA 108 (90 BASE) MCG/ACT IN AERS: 1.0000 | INHALATION_SPRAY | RESPIRATORY_TRACT | 0 refills | Status: DC | PRN

## 2020-09-15 NOTE — Progress Notes (Signed)
We are sorry you are not feeling well.  Here is how we plan to help!  Based on what you have shared with me, it looks like you may have a viral upper respiratory infection.  Upper respiratory infections are caused by a large number of viruses; however, rhinovirus is the most common cause.   Symptoms vary from person to person, with common symptoms including sore throat, cough, fatigue or lack of energy and feeling of general discomfort.  A low-grade fever of up to 100.4 may present, but is often uncommon.  Symptoms vary however, and are closely related to a person's age or underlying illnesses.  The most common symptoms associated with an upper respiratory infection are nasal discharge or congestion, cough, sneezing, headache and pressure in the ears and face.  These symptoms usually persist for about 3 to 10 days, but can last up to 2 weeks.  It is important to know that upper respiratory infections do not cause serious illness or complications in most cases.    Upper respiratory infections can be transmitted from person to person, with the most common method of transmission being a person's hands.  The virus is able to live on the skin and can infect other persons for up to 2 hours after direct contact.  Also, these can be transmitted when someone coughs or sneezes; thus, it is important to cover the mouth to reduce this risk.  To keep the spread of the illness at bay, good hand hygiene is very important.  This is an infection that is most likely caused by a virus. There are no specific treatments other than to help you with the symptoms until the infection runs its course.  We are sorry you are not feeling well.  Here is how we plan to help!   For nasal congestion, you may use an oral decongestants such as Mucinex D or if you have glaucoma or high blood pressure use plain Mucinex.  Saline nasal spray or nasal drops can help and can safely be used as often as needed for congestion.    If you do not  have a history of heart disease, hypertension, diabetes or thyroid disease, prostate/bladder issues or glaucoma, you may also use Sudafed to treat nasal congestion.  It is highly recommended that you consult with a pharmacist or your primary care physician to ensure this medication is safe for you to take.     If you have a cough, you may use cough suppressants such as Delsym and Robitussin.  If you have glaucoma or high blood pressure, you can also use Coricidin HBP.   For cough I have prescribed for you  Albuterol inhaler 1-2 puffs every 4 hours as needed for coughing,  Medrol dosepack use as directed.  You may take over the counter cough suppressants and cold medicine as needed.  If you have a sore or scratchy throat, use a saltwater gargle-  to  teaspoon of salt dissolved in a 4-ounce to 8-ounce glass of warm water.  Gargle the solution for approximately 15-30 seconds and then spit.  It is important not to swallow the solution.  You can also use throat lozenges/cough drops and Chloraseptic spray to help with throat pain or discomfort.  Warm or cold liquids can also be helpful in relieving throat pain.  For headache, pain or general discomfort, you can use Ibuprofen or Tylenol as directed.   Some authorities believe that zinc sprays or the use of Echinacea may shorten the course  of your symptoms.   HOME CARE . Only take medications as instructed by your medical team. . Be sure to drink plenty of fluids. Water is fine as well as fruit juices, sodas and electrolyte beverages. You may want to stay away from caffeine or alcohol. If you are nauseated, try taking small sips of liquids. How do you know if you are getting enough fluid? Your urine should be a pale yellow or almost colorless. . Get rest. . Taking a steamy shower or using a humidifier may help nasal congestion and ease sore throat pain. You can place a towel over your head and breathe in the steam from hot water coming from a  faucet. . Using a saline nasal spray works much the same way. . Cough drops, hard candies and sore throat lozenges may ease your cough. . Avoid close contacts especially the very young and the elderly . Cover your mouth if you cough or sneeze . Always remember to wash your hands.   GET HELP RIGHT AWAY IF: . You develop worsening fever. . If your symptoms do not improve within 10 days . You develop yellow or green discharge from your nose over 3 days. . You have coughing fits . You develop a severe head ache or visual changes. . You develop shortness of breath, difficulty breathing or start having chest pain . Your symptoms persist after you have completed your treatment plan  MAKE SURE YOU   Understand these instructions.  Will watch your condition.  Will get help right away if you are not doing well or get worse.  Your e-visit answers were reviewed by a board certified advanced clinical practitioner to complete your personal care plan. Depending upon the condition, your plan could have included both over the counter or prescription medications. Please review your pharmacy choice. If there is a problem, you may call our nursing hot line at and have the prescription routed to another pharmacy. Your safety is important to [place]. If you have drug allergies check your prescription carefully.   You can use MyChart to ask questions about today's visit, request a non-urgent call back, or ask for a work or school excuse for 24 hours related to this e-Visit. If it has been greater than 24 hours you will need to follow up with your provider, or enter a new e-Visit to address those concerns. You will get an e-mail in the next two days asking about your experience.  I hope that your e-visit has been valuable and will speed your recovery. Thank you for using e-visits.     **Please do not respond to this message unless you have follow up questions.** Greater than 5 but less than 10 minutes spent  researching, coordinating, and implementing care for this patient today

## 2020-10-13 ENCOUNTER — Telehealth: Payer: BC Managed Care – PPO | Admitting: Emergency Medicine

## 2020-10-13 DIAGNOSIS — J0191 Acute recurrent sinusitis, unspecified: Secondary | ICD-10-CM

## 2020-10-13 MED ORDER — IPRATROPIUM BROMIDE 0.03 % NA SOLN: 2.0000 | Freq: Two times a day (BID) | NASAL | 0 refills | Status: AC

## 2020-10-13 MED ORDER — METHYLPREDNISOLONE 4 MG PO TBPK: ORAL_TABLET | ORAL | 0 refills | Status: DC

## 2020-10-13 NOTE — Progress Notes (Signed)
We are sorry that you are not feeling well.  Here is how we plan to help!  Based on what you have shared with me it looks like you have sinusitis.  Sinusitis is inflammation and infection in the sinus cavities of the head.  Based on your presentation I believe you most likely have Acute Viral Sinusitis.This is an infection most likely caused by a virus. There is not specific treatment for viral sinusitis other than to help you with the symptoms until the infection runs its course.  You may use an oral decongestant such as Mucinex D or if you have glaucoma or high blood pressure use plain Mucinex. Saline nasal spray help and can safely be used as often as needed for congestion, I have prescribed: Ipratropium Bromide nasal spray 0.03% 2 sprays in eah nostril 2-3 times a day  I have also prescribed a medrol dosepak  According to the current IDSA guidelines, you do not meet criteria for bacterial infection requiring antibiotic therapy as treatment. Those guidelines are as follows:  1) Persistent symptoms or signs of acute rhinosinusitis lasting 10 days or more without clinical improvement  2) Severe symptoms or signs of high fever (102F [39C] or higher) and purulent nasal discharge or facial pain for three days or more at the beginning of illness  3) Worsening symptoms or signs (i.e., fever, headache, or increase in nasal discharge) that lasted five to six days, got better, and then worsened (double-sickening)    Some authorities believe that zinc sprays or the use of Echinacea may shorten the course of your symptoms.  Sinus infections are not as easily transmitted as other respiratory infection, however we still recommend that you avoid close contact with loved ones, especially the very young and elderly.  Remember to wash your hands thoroughly throughout the day as this is the number one way to prevent the spread of infection!  Home Care:  Only take medications as instructed by your medical  team.  Do not take these medications with alcohol.  A steam or ultrasonic humidifier can help congestion.  You can place a towel over your head and breathe in the steam from hot water coming from a faucet.  Avoid close contacts especially the very young and the elderly.  Cover your mouth when you cough or sneeze.  Always remember to wash your hands.  Get Help Right Away If:  You develop worsening fever or sinus pain.  You develop a severe head ache or visual changes.  Your symptoms persist after you have completed your treatment plan.  Make sure you  Understand these instructions.  Will watch your condition.  Will get help right away if you are not doing well or get worse.  Your e-visit answers were reviewed by a board certified advanced clinical practitioner to complete your personal care plan.  Depending on the condition, your plan could have included both over the counter or prescription medications.  If there is a problem please reply  once you have received a response from your provider.  Your safety is important to [place].  If you have drug allergies check your prescription carefully.    You can use MyChart to ask questions about today's visit, request a non-urgent call back, or ask for a work or school excuse for 24 hours related to this e-Visit. If it has been greater than 24 hours you will need to follow up with your provider, or enter a new e-Visit to address those concerns.  You will  get an e-mail in the next two days asking about your experience.  I hope that your e-visit has been valuable and will speed your recovery. Thank you for using e-visits.   **Please do not respond to this message unless you have follow up questions.** Greater than 5 but less than 10 minutes spent researching, coordinating, and implementing care for this patient today

## 2021-01-10 ENCOUNTER — Telehealth: Payer: BC Managed Care – PPO | Admitting: Family

## 2021-01-10 DIAGNOSIS — M546 Pain in thoracic spine: Secondary | ICD-10-CM | POA: Diagnosis not present

## 2021-01-10 MED ORDER — BACLOFEN 10 MG PO TABS: 10.0000 mg | ORAL_TABLET | Freq: Three times a day (TID) | ORAL | 0 refills | Status: DC

## 2021-01-10 MED ORDER — NAPROXEN 500 MG PO TABS: 500.0000 mg | ORAL_TABLET | Freq: Two times a day (BID) | ORAL | 0 refills | Status: DC

## 2021-01-10 NOTE — Progress Notes (Signed)

## 2021-02-02 ENCOUNTER — Other Ambulatory Visit: Payer: Self-pay | Admitting: Family

## 2021-02-02 DIAGNOSIS — M546 Pain in thoracic spine: Secondary | ICD-10-CM

## 2021-03-19 ENCOUNTER — Telehealth: Payer: BC Managed Care – PPO | Admitting: Emergency Medicine

## 2021-03-19 DIAGNOSIS — J019 Acute sinusitis, unspecified: Secondary | ICD-10-CM

## 2021-03-19 DIAGNOSIS — B9689 Other specified bacterial agents as the cause of diseases classified elsewhere: Secondary | ICD-10-CM

## 2021-03-19 MED ORDER — DOXYCYCLINE HYCLATE 100 MG PO TABS: 100.0000 mg | ORAL_TABLET | Freq: Two times a day (BID) | ORAL | 0 refills | Status: DC

## 2021-03-19 NOTE — Progress Notes (Signed)

## 2021-09-11 DIAGNOSIS — F411 Generalized anxiety disorder: Secondary | ICD-10-CM | POA: Diagnosis not present

## 2021-09-13 DIAGNOSIS — R197 Diarrhea, unspecified: Secondary | ICD-10-CM | POA: Diagnosis not present

## 2021-09-13 DIAGNOSIS — Z23 Encounter for immunization: Secondary | ICD-10-CM | POA: Diagnosis not present

## 2021-09-13 DIAGNOSIS — R5383 Other fatigue: Secondary | ICD-10-CM | POA: Diagnosis not present

## 2021-09-13 DIAGNOSIS — Z0000 Encounter for general adult medical examination without abnormal findings: Secondary | ICD-10-CM | POA: Diagnosis not present

## 2021-09-25 DIAGNOSIS — F411 Generalized anxiety disorder: Secondary | ICD-10-CM | POA: Diagnosis not present

## 2021-10-09 DIAGNOSIS — F411 Generalized anxiety disorder: Secondary | ICD-10-CM | POA: Diagnosis not present

## 2021-10-19 DIAGNOSIS — L301 Dyshidrosis [pompholyx]: Secondary | ICD-10-CM | POA: Diagnosis not present

## 2021-12-24 ENCOUNTER — Telehealth: Payer: Self-pay | Admitting: Nurse Practitioner

## 2021-12-24 DIAGNOSIS — J0140 Acute pansinusitis, unspecified: Secondary | ICD-10-CM

## 2021-12-24 MED ORDER — DOXYCYCLINE HYCLATE 100 MG PO TABS: 100.0000 mg | ORAL_TABLET | Freq: Two times a day (BID) | ORAL | 0 refills | Status: AC

## 2021-12-24 NOTE — Progress Notes (Signed)
E-Visit for Sinus Problems ° °We are sorry that you are not feeling well.  Here is how we plan to help! ° °Based on what you have shared with me it looks like you have sinusitis.  Sinusitis is inflammation and infection in the sinus cavities of the head.  Based on your presentation I believe you most likely have Acute Bacterial Sinusitis.  This is an infection caused by bacteria and is treated with antibiotics. I have prescribed Doxycycline 100mg by mouth twice a day for 10 days. You may use an oral decongestant such as Mucinex D or if you have glaucoma or high blood pressure use plain Mucinex. Saline nasal spray help and can safely be used as often as needed for congestion.  If you develop worsening sinus pain, fever or notice severe headache and vision changes, or if symptoms are not better after completion of antibiotic, please schedule an appointment with a health care provider.   ° °Sinus infections are not as easily transmitted as other respiratory infection, however we still recommend that you avoid close contact with loved ones, especially the very young and elderly.  Remember to wash your hands thoroughly throughout the day as this is the number one way to prevent the spread of infection! ° °Home Care: °Only take medications as instructed by your medical team. °Complete the entire course of an antibiotic. °Do not take these medications with alcohol. °A steam or ultrasonic humidifier can help congestion.  You can place a towel over your head and breathe in the steam from hot water coming from a faucet. °Avoid close contacts especially the very young and the elderly. °Cover your mouth when you cough or sneeze. °Always remember to wash your hands. ° °Get Help Right Away If: °You develop worsening fever or sinus pain. °You develop a severe head ache or visual changes. °Your symptoms persist after you have completed your treatment plan. ° °Make sure you °Understand these instructions. °Will watch your  condition. °Will get help right away if you are not doing well or get worse. ° °Thank you for choosing an e-visit. ° °Your e-visit answers were reviewed by a board certified advanced clinical practitioner to complete your personal care plan. Depending upon the condition, your plan could have included both over the counter or prescription medications. ° °Please review your pharmacy choice. Make sure the pharmacy is open so you can pick up prescription now. If there is a problem, you may contact your provider through MyChart messaging and have the prescription routed to another pharmacy.  Your safety is important to us. If you have drug allergies check your prescription carefully.  ° °For the next 24 hours you can use MyChart to ask questions about today's visit, request a non-urgent call back, or ask for a work or school excuse. °You will get an email in the next two days asking about your experience. I hope that your e-visit has been valuable and will speed your recovery.  ° °I spent approximately 7 minutes reviewing the patient's history, current symptoms and coordinating their plan of care today.   ° °Meds ordered this encounter  °Medications  ° doxycycline (VIBRA-TABS) 100 MG tablet  °  Sig: Take 1 tablet (100 mg total) by mouth 2 (two) times daily for 10 days.  °  Dispense:  20 tablet  °  Refill:  0  °  °

## 2022-04-03 ENCOUNTER — Telehealth: Payer: Self-pay | Admitting: Urgent Care

## 2022-04-03 DIAGNOSIS — J0101 Acute recurrent maxillary sinusitis: Secondary | ICD-10-CM

## 2022-04-03 MED ORDER — LEVOFLOXACIN 500 MG PO TABS: 500.0000 mg | ORAL_TABLET | Freq: Every day | ORAL | 0 refills | Status: AC

## 2022-04-03 NOTE — Progress Notes (Signed)
E-Visit for Sinus Problems ? ?We are sorry that you are not feeling well.  Here is how we plan to help! ? ?Based on what you have shared with me it looks like you have sinusitis.  Sinusitis is inflammation and infection in the sinus cavities of the head.  Based on your presentation I believe you most likely have Acute Bacterial Sinusitis.  This is an infection caused by bacteria and is treated with antibiotics. I have prescribed Levofloxicin 500mg  by mouth once daily for 7 days. You may use an oral decongestant such as Mucinex D or if you have glaucoma or high blood pressure use plain Mucinex. Saline nasal spray help and can safely be used as often as needed for congestion.  If you develop worsening sinus pain, fever or notice severe headache and vision changes, or if symptoms are not better after completion of antibiotic, please schedule an appointment with a health care provider.   ? ?Sinus infections are not as easily transmitted as other respiratory infection, however we still recommend that you avoid close contact with loved ones, especially the very young and elderly.  Remember to wash your hands thoroughly throughout the day as this is the number one way to prevent the spread of infection! ? ?Your cough is likely secondary to post nasal drainage. Once this is cleared up with the antibiotics, it should resolve. You can take Dimetapp (dextromethorphan and chlorpheniramine) which is available over the counter only at night for a few days if needed. ? ?Home Care: ?Only take medications as instructed by your medical team. ?Complete the entire course of an antibiotic. ?Do not take these medications with alcohol. ?A steam or ultrasonic humidifier can help congestion.  You can place a towel over your head and breathe in the steam from hot water coming from a faucet. ?Avoid close contacts especially the very young and the elderly. ?Cover your mouth when you cough or sneeze. ?Always remember to wash your hands. ? ?Get  Help Right Away If: ?You develop worsening fever or sinus pain. ?You develop a severe head ache or visual changes. ?Your symptoms persist after you have completed your treatment plan. ? ?Make sure you ?Understand these instructions. ?Will watch your condition. ?Will get help right away if you are not doing well or get worse. ? ?Thank you for choosing an e-visit. ? ?Your e-visit answers were reviewed by a board certified advanced clinical practitioner to complete your personal care plan. Depending upon the condition, your plan could have included both over the counter or prescription medications. ? ?Please review your pharmacy choice. Make sure the pharmacy is open so you can pick up prescription now. If there is a problem, you may contact your provider through [employer] and have the prescription routed to another pharmacy.  Your safety is important to [place]. If you have drug allergies check your prescription carefully.  ? ?For the next 24 hours you can use MyChart to ask questions about today's visit, request a non-urgent call back, or ask for a work or school excuse. ?You will get an email in the next two days asking about your experience. I hope that your e-visit has been valuable and will speed your recovery.  ? ?I have spent 9 minutes in review of e-visit questionnaire, review and updating patient chart, medical decision making and response to patient.  ? ?Kobe Jansma L Siarra Gilkerson, PA ? ?  ?

## 2022-07-12 ENCOUNTER — Other Ambulatory Visit (HOSPITAL_COMMUNITY)
Admission: RE | Admit: 2022-07-12 | Discharge: 2022-07-12 | Disposition: A | Discharge status: Home / Self Care | Payer: BC Managed Care – PPO | Source: Ambulatory Visit | Attending: Family Medicine | Admitting: Family Medicine

## 2022-07-12 ENCOUNTER — Other Ambulatory Visit: Payer: Self-pay | Admitting: Family Medicine

## 2022-07-12 DIAGNOSIS — Z01419 Encounter for gynecological examination (general) (routine) without abnormal findings: Secondary | ICD-10-CM | POA: Diagnosis present

## 2022-07-16 LAB — CYTOLOGY - PAP: Diagnosis: NEGATIVE

## 2022-10-01 ENCOUNTER — Telehealth: Payer: BC Managed Care – PPO | Admitting: Family Medicine

## 2022-10-01 DIAGNOSIS — H44001 Unspecified purulent endophthalmitis, right eye: Secondary | ICD-10-CM | POA: Diagnosis not present

## 2022-10-01 MED ORDER — POLYMYXIN B-TRIMETHOPRIM 10000-0.1 UNIT/ML-% OP SOLN: 1.0000 [drp] | Freq: Four times a day (QID) | OPHTHALMIC | 0 refills | Status: AC

## 2022-10-01 NOTE — Progress Notes (Signed)

## 2023-10-14 NOTE — Progress Notes (Signed)
Cardiology Office Note:   Date:  10/17/2023  ID:  Domingo Mend, DOB Sep 18, 1991, MRN [id number] PCP:  Shirlean Mylar, MD  [facility] Providers Cardiologist:  Alverda Skeans, MD Referring MD: Camie Patience, FNP  Chief Complaint/Reason for Referral: Palpitations ASSESSMENT:    1. Palpitations   2. Dizziness     PLAN:   In order of problems listed above: Palpitations: Will obtain monitor, reflex TSH, CMP, CBC, and echocardiogram to evaluate further.  I suspect this was a vasovagal episode.  She exercises without angina; I do not think an ischemic evaluation is required.  Her LDL is being followed by her primary care provider. Dizziness: See discussion above.            Dispo:  Return if symptoms worsen or fail to improve.      Medication Adjustments/Labs and Tests Ordered: Current medicines are reviewed at length with the patient today.  Concerns regarding medicines are outlined above.  The following changes have been made:  no change   Labs/tests ordered: Orders Placed This Encounter  Procedures   CBC   Comprehensive metabolic panel   TSH Rfx on Abnormal to Free T4   LONG TERM MONITOR (3-14 DAYS)   EKG 12-Lead   ECHOCARDIOGRAM COMPLETE    Medication Changes: No orders of the defined types were placed in this encounter.   Current medicines are reviewed at length with the patient today.  The patient does not have concerns regarding medicines.  History of Present Illness:      FOCUSED PROBLEM LIST:   Migraine disorder Family history of coronary artery disease BMI 27 October 2023: The patient is a 32 year old female with the above listed medical problems here for recommendations regarding palpitations.  The patient was at her PCPs office and noted that she had been at her gynecologist office and had a near passing out spell associated with palpitations.  The patient was having her blood pressure checked.  She felt very flushed and her blood pressure ended up  being very low.  She waited a few minutes and drink some water and felt better.  She has had no recurrence of the symptoms.  She denies any chest pain or shortness of breath.  She notices when she exercises that her heart rate will be relatively high.  She exercises about 3 times a week by walking and using light weights.  She does not smoke tobacco but occasionally smokes marijuana.  She otherwise denies any peripheral edema, paroxysmal atrial dyspnea, orthopnea, signs or symptoms of stroke, severe bleeding, or fevers or chills.  Pertinent labs include LDL of 174 in July of this year.  Her creatinine was normal.          Current Medications: Current Meds  Medication Sig   escitalopram (LEXAPRO) 10 MG tablet Take 5 mg by mouth daily.   ipratropium (ATROVENT) 0.03 % nasal spray Place 2 sprays into both nostrils every 12 (twelve) hours.   norethindrone-ethinyl estradiol-FE (JUNEL FE 1/20) 1-20 MG-MCG tablet Take 1 tablet by mouth daily.   NURTEC 75 MG TBDP Take 1 tablet by mouth as needed.   ondansetron (ZOFRAN-ODT) 8 MG disintegrating tablet DISSOLVE 1 TABLET ON THE TONGUE 3 TIMES A DAY AS NEEDED FOR NAUSEA     Review of Systems:   Please see the history of present illness.    All other systems reviewed and are negative.     EKGs/Labs/Other Test Reviewed:   EKG:    EKG Interpretation Date/Time:  Friday October 17 2023 08:17:40 EDT Ventricular Rate:  75 PR Interval:  138 QRS Duration:  82 QT Interval:  396 QTC Calculation: 442 R Axis:   34  Text Interpretation: Normal sinus rhythm Normal ECG No previous ECGs available Confirmed by Alverda Skeans ([id number]) on 10/17/2023 8:20:46 AM         Risk Assessment/Calculations:          Physical Exam:   VS:  BP 136/88   Pulse 78   Ht 5\' 8"  (1.727 m)   Wt 166 lb 12.8 oz (75.7 kg)   SpO2 97%   BMI 25.36 kg/m        Wt Readings from Last 3 Encounters:  10/17/23 166 lb 12.8 oz (75.7 kg)  07/05/19 150 lb 6.4 oz (68.2 kg)  07/21/17  146 lb 8 oz (66.5 kg)      GENERAL:  No apparent distress, AOx3 HEENT:  No carotid bruits, +2 carotid impulses, no scleral icterus CAR: RRR no murmurs, gallops, rubs, or thrills RES:  Clear to auscultation bilaterally ABD:  Soft, nontender, nondistended, positive bowel sounds x 4 VASC:  +2 radial pulses, +2 carotid pulses NEURO:  CN 2-12 grossly intact; motor and sensory grossly intact PSYCH:  No active depression or anxiety EXT:  No edema, ecchymosis, or cyanosis  Signed, Orbie Pyo, MD  10/17/2023 8:53 AM    [facility] [address] [place], [place], [place]  [address] Phone: [phone]; Fax: [phone]   Note:  This document was prepared using Dragon voice recognition software and may include unintentional dictation errors.

## 2023-10-17 ENCOUNTER — Ambulatory Visit: Payer: BC Managed Care – PPO | Attending: Internal Medicine | Admitting: Internal Medicine

## 2023-10-17 ENCOUNTER — Ambulatory Visit: Payer: BC Managed Care – PPO | Attending: Internal Medicine

## 2023-10-17 ENCOUNTER — Encounter: Payer: Self-pay | Admitting: Internal Medicine

## 2023-10-17 VITALS — BP 136/88 | HR 78 | Ht 68.0 in | Wt 166.8 lb

## 2023-10-17 DIAGNOSIS — R42 Dizziness and giddiness: Secondary | ICD-10-CM | POA: Diagnosis not present

## 2023-10-17 DIAGNOSIS — R002 Palpitations: Secondary | ICD-10-CM

## 2023-10-17 NOTE — Progress Notes (Unsigned)
Enrolled for [employer] to mail a ZIO XT long term holter monitor to the patients address on file.  

## 2023-10-17 NOTE — Patient Instructions (Signed)
Medication Instructions:  No changes *If you need a refill on your cardiac medications before your next appointment, please call your pharmacy*   Lab Work: Today: cbc, cmet, tsh w reflex free T4 if abnormal  If you have labs (blood work) drawn today and your tests are completely normal, you will receive your results only by: MyChart Message (if you have MyChart) OR A paper copy in the mail If you have any lab test that is abnormal or we need to change your treatment, we will call you to review the results.   Testing/Procedures: Your physician has requested that you have an echocardiogram. Echocardiography is a painless test that uses sound waves to create images of your heart. It provides your doctor with information about the size and shape of your heart and how well your heart's chambers and valves are working. This procedure takes approximately one hour. There are no restrictions for this procedure. Please do NOT wear cologne, perfume, aftershave, or lotions (deodorant is allowed). Please arrive 15 minutes prior to your appointment time.  3 Day Zio Monitor - see instructions below   Follow-Up: As needed  Other Instructions ZIO XT- Long Term Monitor Instructions  Your physician has requested you wear a ZIO patch monitor for 3 days.  This is a single patch monitor. [employer] supplies one patch monitor per enrollment. Additional stickers are not available. Please do not apply patch if you will be having a Nuclear Stress Test,  Echocardiogram, Cardiac CT, MRI, or Chest Xray during the period you would be wearing the  monitor. The patch cannot be worn during these tests. You cannot remove and re-apply the  ZIO XT patch monitor.  Your ZIO patch monitor will be mailed 3 day USPS to your address on file. It may take 3-5 days  to receive your monitor after you have been enrolled.  Once you have received your monitor, please review the enclosed instructions. Your monitor  has already been  registered assigning a specific monitor serial # to you.  Billing and Patient Assistance Program Information  We have supplied [employer] with any of your insurance information on file for billing purposes. [employer] offers a sliding scale Patient Assistance Program for patients that do not have  insurance, or whose insurance does not completely cover the cost of the ZIO monitor.  You must apply for the Patient Assistance Program to qualify for this discounted rate.  To apply, please call [employer] at [phone], select option 4, select option 2, ask to apply for  Patient Assistance Program. Meredeth Ide will ask your household income, and how many people  are in your household. They will quote your out-of-pocket cost based on that information.  [employer] will also be able to set up a 74-month, interest-free payment plan if needed.  Applying the monitor  Hold abrader disc by orange tab. Rub abrader in 40 strokes over the upper left chest as  indicated in your monitor instructions.  Clean area with 4 enclosed alcohol pads. Let dry.  Apply patch as indicated in monitor instructions. Patch will be placed under collarbone on left  side of chest with arrow pointing upward.  Rub patch adhesive wings for 2 minutes. Remove white label marked "1". Remove the white  label marked "2". Rub patch adhesive wings for 2 additional minutes.  While looking in a mirror, press and release button in center of patch. A small green light will  flash 3-4 times. This will be your only indicator that the monitor has been  turned on.  Do not shower for the first 24 hours. You may shower after the first 24 hours.  Press the button if you feel a symptom. You will hear a small click. Record Date, Time and  Symptom in the Patient Logbook.  When you are ready to remove the patch, follow instructions on the last 2 pages of Patient  Logbook. Stick patch monitor onto the last page of Patient Logbook.  Place Patient Logbook in the blue  and white box. Use locking tab on box and tape box closed  securely. The blue and white box has prepaid postage on it. Please place it in the mailbox as  soon as possible. Your physician should have your test results approximately 7 days after the  monitor has been mailed back to [facility].  Call [facility] Customer Care at [phone] if you have questions regarding  your ZIO XT patch monitor. Call them immediately if you see an orange light blinking on your  monitor.  If your monitor falls off in less than 4 days, contact our Monitor department at [phone].  If your monitor becomes loose or falls off after 4 days call [employer] at [phone] for  suggestions on securing your monitor

## 2023-10-18 LAB — COMPREHENSIVE METABOLIC PANEL
ALT: 9 [IU]/L (ref 0–32)
AST: 17 [IU]/L (ref 0–40)
Albumin: 4.1 g/dL (ref 4.0–5.0)
Alkaline Phosphatase: 51 [IU]/L (ref 44–121)
BUN/Creatinine Ratio: 17 (ref 9–23)
BUN: 12 mg/dL (ref 6–20)
Bilirubin Total: <0.2 mg/dL (ref 0.0–1.2)
CO2: 24 mmol/L (ref 20–29)
Calcium: 9.4 mg/dL (ref 8.7–10.2)
Chloride: 106 mmol/L (ref 96–106)
Creatinine, Ser: 0.7 mg/dL (ref 0.57–1.00)
Globulin, Total: 2.4 g/dL (ref 1.5–4.5)
Glucose: 103 mg/dL — ABNORMAL HIGH (ref 70–99)
Potassium: 4.3 mmol/L (ref 3.5–5.2)
Sodium: 141 mmol/L (ref 134–144)
Total Protein: 6.5 g/dL (ref 6.0–8.5)
eGFR: 119 mL/min/{1.73_m2} (ref >59)

## 2023-10-18 LAB — CBC
Hematocrit: 40.6 % (ref 34.0–46.6)
Hemoglobin: 13.3 g/dL (ref 11.1–15.9)
MCH: 29.6 pg (ref 26.6–33.0)
MCHC: 32.8 g/dL (ref 31.5–35.7)
MCV: 90 fL (ref 79–97)
Platelets: 282 10*3/uL (ref 150–450)
RBC: 4.49 x10E6/uL (ref 3.77–5.28)
RDW: 12 % (ref 11.7–15.4)
WBC: 6.4 10*3/uL (ref 3.4–10.8)

## 2023-10-18 LAB — TSH RFX ON ABNORMAL TO FREE T4: TSH: 1.83 u[IU]/mL (ref 0.450–4.500)

## 2023-11-11 ENCOUNTER — Ambulatory Visit (HOSPITAL_COMMUNITY): Payer: BC Managed Care – PPO | Attending: Internal Medicine

## 2023-11-11 DIAGNOSIS — I371 Nonrheumatic pulmonary valve insufficiency: Secondary | ICD-10-CM

## 2023-11-11 DIAGNOSIS — R002 Palpitations: Secondary | ICD-10-CM | POA: Insufficient documentation

## 2023-11-11 LAB — ECHOCARDIOGRAM COMPLETE
Area-P 1/2: 4.45 cm2
S' Lateral: 2.3 cm

## 2024-01-12 ENCOUNTER — Telehealth: Payer: Self-pay | Admitting: Physician Assistant

## 2024-01-12 DIAGNOSIS — J019 Acute sinusitis, unspecified: Secondary | ICD-10-CM

## 2024-01-12 DIAGNOSIS — B9689 Other specified bacterial agents as the cause of diseases classified elsewhere: Secondary | ICD-10-CM

## 2024-01-12 MED ORDER — LEVOFLOXACIN 500 MG PO TABS
500.0000 mg | ORAL_TABLET | Freq: Every day | ORAL | 0 refills | Status: AC
Start: 2024-01-12 — End: 2024-01-19

## 2024-01-12 NOTE — Progress Notes (Signed)

## 2024-12-14 ENCOUNTER — Telehealth: Payer: Self-pay | Admitting: Family Medicine

## 2024-12-14 DIAGNOSIS — J069 Acute upper respiratory infection, unspecified: Secondary | ICD-10-CM | POA: Diagnosis not present

## 2024-12-14 MED ORDER — FLUTICASONE PROPIONATE 50 MCG/ACT NA SUSP: 2.0000 | Freq: Every day | NASAL | 0 refills | Status: AC

## 2024-12-14 MED ORDER — PSEUDOEPH-BROMPHEN-DM 30-2-10 MG/5ML PO SYRP: 5.0000 mL | ORAL_SOLUTION | Freq: Four times a day (QID) | ORAL | 0 refills | Status: AC | PRN

## 2024-12-14 NOTE — Progress Notes (Signed)
